# Patient Record
Sex: Male | Born: 1960 | Race: Black or African American | Hispanic: No | Marital: Single | Smoking: Never smoker
Health system: Southern US, Community
[De-identification: ages and names within clinical notes are randomized; demographics above are authoritative.]

## PROBLEM LIST (undated history)

## (undated) DIAGNOSIS — D649 Anemia, unspecified: Secondary | ICD-10-CM

## (undated) DIAGNOSIS — C20 Malignant neoplasm of rectum: Secondary | ICD-10-CM

## (undated) HISTORY — DX: Malignant neoplasm of rectum: C20

## (undated) HISTORY — DX: Anemia, unspecified: D64.9

---

## 2006-02-05 ENCOUNTER — Emergency Department (HOSPITAL_COMMUNITY): Admission: EM | Admit: 2006-02-05 | Discharge: 2006-02-05 | Payer: Self-pay | Admitting: Emergency Medicine

## 2006-02-06 ENCOUNTER — Ambulatory Visit (HOSPITAL_COMMUNITY): Admission: RE | Admit: 2006-02-06 | Discharge: 2006-02-06 | Payer: Self-pay | Admitting: Emergency Medicine

## 2006-02-06 ENCOUNTER — Encounter (INDEPENDENT_AMBULATORY_CARE_PROVIDER_SITE_OTHER): Payer: Self-pay | Admitting: *Deleted

## 2006-02-06 ENCOUNTER — Emergency Department (HOSPITAL_COMMUNITY): Admission: EM | Admit: 2006-02-06 | Discharge: 2006-02-06 | Payer: Self-pay | Admitting: Emergency Medicine

## 2007-08-03 ENCOUNTER — Ambulatory Visit: Payer: Self-pay | Admitting: Gastroenterology

## 2007-08-03 DIAGNOSIS — K625 Hemorrhage of anus and rectum: Secondary | ICD-10-CM

## 2009-06-23 DIAGNOSIS — C20 Malignant neoplasm of rectum: Secondary | ICD-10-CM

## 2009-06-23 HISTORY — DX: Malignant neoplasm of rectum: C20

## 2009-12-17 ENCOUNTER — Encounter: Admission: RE | Admit: 2009-12-17 | Discharge: 2009-12-17 | Payer: Self-pay | Admitting: Gastroenterology

## 2009-12-19 ENCOUNTER — Encounter: Admission: RE | Admit: 2009-12-19 | Discharge: 2009-12-19 | Payer: Self-pay | Admitting: General Surgery

## 2010-01-04 ENCOUNTER — Ambulatory Visit (HOSPITAL_COMMUNITY): Admission: RE | Admit: 2010-01-04 | Discharge: 2010-01-04 | Payer: Self-pay | Admitting: Gastroenterology

## 2010-01-09 ENCOUNTER — Ambulatory Visit: Payer: Self-pay | Admitting: Oncology

## 2010-01-21 ENCOUNTER — Ambulatory Visit (HOSPITAL_COMMUNITY): Admission: RE | Admit: 2010-01-21 | Discharge: 2010-01-21 | Payer: Self-pay | Admitting: General Surgery

## 2010-01-24 LAB — COMPREHENSIVE METABOLIC PANEL
ALT: 21 U/L (ref 0–53)
AST: 26 U/L (ref 0–37)
Albumin: 4.3 g/dL (ref 3.5–5.2)
Alkaline Phosphatase: 41 U/L (ref 39–117)
BUN: 13 mg/dL (ref 6–23)
Potassium: 4.1 mEq/L (ref 3.5–5.3)
Sodium: 141 mEq/L (ref 135–145)
Total Protein: 7.1 g/dL (ref 6.0–8.3)

## 2010-01-24 LAB — CBC WITH DIFFERENTIAL/PLATELET
BASO%: 0.7 % (ref 0.0–2.0)
EOS%: 3.3 % (ref 0.0–7.0)
MCH: 30.3 pg (ref 27.2–33.4)
MCHC: 34 g/dL (ref 32.0–36.0)
MONO#: 0.5 10*3/uL (ref 0.1–0.9)
RBC: 4.12 10*6/uL — ABNORMAL LOW (ref 4.20–5.82)
RDW: 14.4 % (ref 11.0–14.6)
WBC: 6.5 10*3/uL (ref 4.0–10.3)
lymph#: 1.2 10*3/uL (ref 0.9–3.3)

## 2010-01-24 LAB — CHCC SMEAR

## 2010-01-29 ENCOUNTER — Ambulatory Visit: Admission: RE | Admit: 2010-01-29 | Discharge: 2010-03-19 | Payer: Self-pay | Admitting: Radiation Oncology

## 2010-02-12 ENCOUNTER — Ambulatory Visit: Payer: Self-pay | Admitting: Oncology

## 2010-02-28 LAB — CBC WITH DIFFERENTIAL/PLATELET
Basophils Absolute: 0 10*3/uL (ref 0.0–0.1)
Eosinophils Absolute: 0.1 10*3/uL (ref 0.0–0.5)
HCT: 36.2 % — ABNORMAL LOW (ref 38.4–49.9)
HGB: 12.1 g/dL — ABNORMAL LOW (ref 13.0–17.1)
LYMPH%: 5.4 % — ABNORMAL LOW (ref 14.0–49.0)
MCV: 92 fL (ref 79.3–98.0)
MONO#: 0.3 10*3/uL (ref 0.1–0.9)
MONO%: 6.6 % (ref 0.0–14.0)
NEUT#: 3.8 10*3/uL (ref 1.5–6.5)
Platelets: 151 10*3/uL (ref 140–400)
RBC: 3.94 10*6/uL — ABNORMAL LOW (ref 4.20–5.82)
WBC: 4.6 10*3/uL (ref 4.0–10.3)

## 2010-02-28 LAB — BASIC METABOLIC PANEL
BUN: 11 mg/dL (ref 6–23)
CO2: 29 mEq/L (ref 19–32)
Calcium: 9.3 mg/dL (ref 8.4–10.5)
Glucose, Bld: 104 mg/dL — ABNORMAL HIGH (ref 70–99)
Sodium: 139 mEq/L (ref 135–145)

## 2010-03-08 LAB — CBC WITH DIFFERENTIAL/PLATELET
BASO%: 0.6 % (ref 0.0–2.0)
EOS%: 1.7 % (ref 0.0–7.0)
HCT: 36.9 % — ABNORMAL LOW (ref 38.4–49.9)
LYMPH%: 5.7 % — ABNORMAL LOW (ref 14.0–49.0)
MCH: 31.2 pg (ref 27.2–33.4)
MCHC: 33.5 g/dL (ref 32.0–36.0)
NEUT%: 82.1 % — ABNORMAL HIGH (ref 39.0–75.0)
Platelets: 155 10*3/uL (ref 140–400)
RBC: 3.96 10*6/uL — ABNORMAL LOW (ref 4.20–5.82)
WBC: 4.5 10*3/uL (ref 4.0–10.3)

## 2010-03-22 ENCOUNTER — Ambulatory Visit: Payer: Self-pay | Admitting: Oncology

## 2010-03-26 LAB — CBC WITH DIFFERENTIAL/PLATELET
Basophils Absolute: 0 10*3/uL (ref 0.0–0.1)
Eosinophils Absolute: 0.5 10*3/uL (ref 0.0–0.5)
HCT: 40.1 % (ref 38.4–49.9)
HGB: 13.2 g/dL (ref 13.0–17.1)
MCH: 31.2 pg (ref 27.2–33.4)
MONO#: 0.5 10*3/uL (ref 0.1–0.9)
NEUT#: 3.4 10*3/uL (ref 1.5–6.5)
NEUT%: 69.2 % (ref 39.0–75.0)
RDW: 18.6 % — ABNORMAL HIGH (ref 11.0–14.6)
WBC: 4.9 10*3/uL (ref 4.0–10.3)
lymph#: 0.5 10*3/uL — ABNORMAL LOW (ref 0.9–3.3)

## 2010-04-05 ENCOUNTER — Ambulatory Visit (HOSPITAL_COMMUNITY): Admission: RE | Admit: 2010-04-05 | Discharge: 2010-04-05 | Payer: Self-pay | Admitting: Gastroenterology

## 2010-05-02 ENCOUNTER — Ambulatory Visit: Payer: Self-pay | Admitting: Oncology

## 2010-05-06 LAB — COMPREHENSIVE METABOLIC PANEL
AST: 25 U/L (ref 0–37)
Alkaline Phosphatase: 39 U/L (ref 39–117)
BUN: 14 mg/dL (ref 6–23)
Creatinine, Ser: 1 mg/dL (ref 0.40–1.50)
Potassium: 4.2 mEq/L (ref 3.5–5.3)

## 2010-05-06 LAB — CBC WITH DIFFERENTIAL/PLATELET
BASO%: 0.3 % (ref 0.0–2.0)
Basophils Absolute: 0 10*3/uL (ref 0.0–0.1)
EOS%: 4 % (ref 0.0–7.0)
HGB: 12.8 g/dL — ABNORMAL LOW (ref 13.0–17.1)
MCH: 31.6 pg (ref 27.2–33.4)
MCHC: 33.1 g/dL (ref 32.0–36.0)
MCV: 95.4 fL (ref 79.3–98.0)
MONO%: 9.7 % (ref 0.0–14.0)
RDW: 17.7 % — ABNORMAL HIGH (ref 11.0–14.6)

## 2010-05-23 HISTORY — PX: LOW ANTERIOR BOWEL RESECTION: SUR1240

## 2010-05-30 ENCOUNTER — Inpatient Hospital Stay (HOSPITAL_COMMUNITY)
Admission: RE | Admit: 2010-05-30 | Discharge: 2010-06-04 | Payer: Self-pay | Source: Home / Self Care | Attending: General Surgery | Admitting: General Surgery

## 2010-05-30 ENCOUNTER — Encounter (INDEPENDENT_AMBULATORY_CARE_PROVIDER_SITE_OTHER): Payer: Self-pay | Admitting: General Surgery

## 2010-06-10 DIAGNOSIS — C7800 Secondary malignant neoplasm of unspecified lung: Secondary | ICD-10-CM | POA: Insufficient documentation

## 2010-06-13 ENCOUNTER — Ambulatory Visit: Payer: Self-pay | Admitting: Oncology

## 2010-07-13 ENCOUNTER — Other Ambulatory Visit: Payer: Self-pay | Admitting: General Surgery

## 2010-07-13 DIAGNOSIS — K913 Postprocedural intestinal obstruction, unspecified as to partial versus complete: Secondary | ICD-10-CM

## 2010-07-14 ENCOUNTER — Encounter: Payer: Self-pay | Admitting: Gastroenterology

## 2010-08-19 ENCOUNTER — Ambulatory Visit
Admission: RE | Admit: 2010-08-19 | Discharge: 2010-08-19 | Disposition: A | Discharge status: Home / Self Care | Payer: Medicare Other | Source: Ambulatory Visit | Attending: General Surgery | Admitting: General Surgery

## 2010-08-19 DIAGNOSIS — K913 Postprocedural intestinal obstruction, unspecified as to partial versus complete: Secondary | ICD-10-CM

## 2010-09-02 LAB — BASIC METABOLIC PANEL
BUN: 11 mg/dL (ref 6–23)
BUN: 4 mg/dL — ABNORMAL LOW (ref 6–23)
BUN: 6 mg/dL (ref 6–23)
BUN: 7 mg/dL (ref 6–23)
CO2: 30 mEq/L (ref 19–32)
CO2: 30 mEq/L (ref 19–32)
CO2: 32 mEq/L (ref 19–32)
Calcium: 8 mg/dL — ABNORMAL LOW (ref 8.4–10.5)
Chloride: 102 mEq/L (ref 96–112)
Chloride: 103 mEq/L (ref 96–112)
Chloride: 104 mEq/L (ref 96–112)
Creatinine, Ser: 0.94 mg/dL (ref 0.4–1.5)
Creatinine, Ser: 1.34 mg/dL (ref 0.4–1.5)
GFR calc Af Amer: >60 mL/min (ref >60)
GFR calc non Af Amer: >60 mL/min (ref >60)
GFR calc non Af Amer: >60 mL/min (ref >60)
Glucose, Bld: 105 mg/dL — ABNORMAL HIGH (ref 70–99)
Glucose, Bld: 114 mg/dL — ABNORMAL HIGH (ref 70–99)
Glucose, Bld: 120 mg/dL — ABNORMAL HIGH (ref 70–99)
Glucose, Bld: 164 mg/dL — ABNORMAL HIGH (ref 70–99)
Potassium: 3.9 mEq/L (ref 3.5–5.1)
Potassium: 4.6 mEq/L (ref 3.5–5.1)
Sodium: 138 mEq/L (ref 135–145)
Sodium: 140 mEq/L (ref 135–145)
Sodium: 140 mEq/L (ref 135–145)

## 2010-09-02 LAB — CROSSMATCH
ABO/RH(D): A POS
Unit division: 0

## 2010-09-02 LAB — CBC
HCT: 23.5 % — ABNORMAL LOW (ref 39.0–52.0)
HCT: 23.7 % — ABNORMAL LOW (ref 39.0–52.0)
HCT: 25.6 % — ABNORMAL LOW (ref 39.0–52.0)
HCT: 25.6 % — ABNORMAL LOW (ref 39.0–52.0)
Hemoglobin: 12.3 g/dL — ABNORMAL LOW (ref 13.0–17.0)
Hemoglobin: 7.9 g/dL — ABNORMAL LOW (ref 13.0–17.0)
Hemoglobin: 8 g/dL — ABNORMAL LOW (ref 13.0–17.0)
Hemoglobin: 8.6 g/dL — ABNORMAL LOW (ref 13.0–17.0)
MCH: 30.3 pg (ref 26.0–34.0)
MCH: 31 pg (ref 26.0–34.0)
MCH: 31.5 pg (ref 26.0–34.0)
MCH: 31.6 pg (ref 26.0–34.0)
MCH: 31.6 pg (ref 26.0–34.0)
MCH: 32 pg (ref 26.0–34.0)
MCHC: 32.8 g/dL (ref 30.0–36.0)
MCHC: 33.6 g/dL (ref 30.0–36.0)
MCHC: 33.7 g/dL (ref 30.0–36.0)
MCHC: 33.8 g/dL (ref 30.0–36.0)
MCV: 92.9 fL (ref 78.0–100.0)
MCV: 93.6 fL (ref 78.0–100.0)
MCV: 94.1 fL (ref 78.0–100.0)
MCV: 94.9 fL (ref 78.0–100.0)
MCV: 95.1 fL (ref 78.0–100.0)
MCV: 95.2 fL (ref 78.0–100.0)
Platelets: 152 10*3/uL (ref 150–400)
Platelets: 155 10*3/uL (ref 150–400)
RBC: 2.72 MIL/uL — ABNORMAL LOW (ref 4.22–5.81)
RBC: 3.5 MIL/uL — ABNORMAL LOW (ref 4.22–5.81)
RBC: 3.91 MIL/uL — ABNORMAL LOW (ref 4.22–5.81)
RDW: 13.9 % (ref 11.5–15.5)
RDW: 14 % (ref 11.5–15.5)
RDW: 14.1 % (ref 11.5–15.5)
RDW: 14.4 % (ref 11.5–15.5)
RDW: 14.4 % (ref 11.5–15.5)
WBC: 11.8 10*3/uL — ABNORMAL HIGH (ref 4.0–10.5)
WBC: 5.7 10*3/uL (ref 4.0–10.5)
WBC: 7.4 10*3/uL (ref 4.0–10.5)

## 2010-09-02 LAB — DIFFERENTIAL
Basophils Absolute: 0 10*3/uL (ref 0.0–0.1)
Eosinophils Relative: 1 % (ref 0–5)
Lymphocytes Relative: 10 % — ABNORMAL LOW (ref 12–46)
Neutro Abs: 3.7 10*3/uL (ref 1.7–7.7)

## 2010-09-02 LAB — COMPREHENSIVE METABOLIC PANEL
BUN: 11 mg/dL (ref 6–23)
CO2: 30 mEq/L (ref 19–32)
Chloride: 106 mEq/L (ref 96–112)
Creatinine, Ser: 1.18 mg/dL (ref 0.4–1.5)
GFR calc non Af Amer: >60 mL/min (ref >60)
Glucose, Bld: 105 mg/dL — ABNORMAL HIGH (ref 70–99)
Total Bilirubin: 0.6 mg/dL (ref 0.3–1.2)

## 2010-09-02 LAB — TYPE AND SCREEN
ABO/RH(D): A POS
Antibody Screen: NEGATIVE

## 2010-09-02 LAB — SURGICAL PCR SCREEN: Staphylococcus aureus: NEGATIVE

## 2010-09-02 LAB — MAGNESIUM: Magnesium: 1.8 mg/dL (ref 1.5–2.5)

## 2010-09-06 LAB — CBC
HCT: 37.8 % — ABNORMAL LOW (ref 39.0–52.0)
Hemoglobin: 12.5 g/dL — ABNORMAL LOW (ref 13.0–17.0)
MCH: 29.3 pg (ref 26.0–34.0)
MCHC: 33.1 g/dL (ref 30.0–36.0)
MCV: 88.5 fL (ref 78.0–100.0)
Platelets: 130 10*3/uL — ABNORMAL LOW (ref 150–400)
RBC: 4.27 MIL/uL (ref 4.22–5.81)
RDW: 13.5 % (ref 11.5–15.5)
WBC: 6.6 10*3/uL (ref 4.0–10.5)

## 2010-09-06 LAB — DIFFERENTIAL
Basophils Relative: 1 % (ref 0–1)
Eosinophils Absolute: 0.2 10*3/uL (ref 0.0–0.7)
Eosinophils Relative: 3 % (ref 0–5)
Monocytes Absolute: 0.6 10*3/uL (ref 0.1–1.0)
Monocytes Relative: 9 % (ref 3–12)
Neutrophils Relative %: 67 % (ref 43–77)

## 2010-09-06 LAB — BASIC METABOLIC PANEL
CO2: 28 mEq/L (ref 19–32)
Calcium: 9.2 mg/dL (ref 8.4–10.5)
Glucose, Bld: 93 mg/dL (ref 70–99)
Sodium: 138 mEq/L (ref 135–145)

## 2010-09-30 ENCOUNTER — Encounter (HOSPITAL_COMMUNITY): Payer: Medicare Other

## 2010-09-30 ENCOUNTER — Other Ambulatory Visit: Payer: Self-pay | Admitting: General Surgery

## 2010-09-30 DIAGNOSIS — Z01818 Encounter for other preprocedural examination: Secondary | ICD-10-CM | POA: Insufficient documentation

## 2010-09-30 DIAGNOSIS — Z01812 Encounter for preprocedural laboratory examination: Secondary | ICD-10-CM | POA: Insufficient documentation

## 2010-09-30 LAB — COMPREHENSIVE METABOLIC PANEL
Albumin: 4.1 g/dL (ref 3.5–5.2)
BUN: 12 mg/dL (ref 6–23)
Creatinine, Ser: 0.9 mg/dL (ref 0.4–1.5)
Glucose, Bld: 86 mg/dL (ref 70–99)
Total Bilirubin: 0.8 mg/dL (ref 0.3–1.2)
Total Protein: 7.3 g/dL (ref 6.0–8.3)

## 2010-09-30 LAB — DIFFERENTIAL
Basophils Absolute: 0 10*3/uL (ref 0.0–0.1)
Basophils Relative: 1 % (ref 0–1)
Eosinophils Relative: 1 % (ref 0–5)
Monocytes Absolute: 0.3 10*3/uL (ref 0.1–1.0)

## 2010-09-30 LAB — SURGICAL PCR SCREEN
MRSA, PCR: NEGATIVE
Staphylococcus aureus: NEGATIVE

## 2010-09-30 LAB — CBC
HCT: 41.2 % (ref 39.0–52.0)
MCH: 26.5 pg (ref 26.0–34.0)
MCHC: 30.8 g/dL (ref 30.0–36.0)
MCV: 85.8 fL (ref 78.0–100.0)
RDW: 17.1 % — ABNORMAL HIGH (ref 11.5–15.5)

## 2010-10-07 ENCOUNTER — Other Ambulatory Visit: Payer: Self-pay | Admitting: General Surgery

## 2010-10-07 ENCOUNTER — Inpatient Hospital Stay (HOSPITAL_COMMUNITY)
Admission: RE | Admit: 2010-10-07 | Discharge: 2010-10-12 | DRG: 331 | Disposition: A | Discharge status: Home / Self Care | Payer: Medicare Other | Source: Ambulatory Visit | Attending: General Surgery | Admitting: General Surgery

## 2010-10-07 DIAGNOSIS — Z432 Encounter for attention to ileostomy: Principal | ICD-10-CM

## 2010-10-07 DIAGNOSIS — Z01812 Encounter for preprocedural laboratory examination: Secondary | ICD-10-CM

## 2010-10-07 DIAGNOSIS — Z85048 Personal history of other malignant neoplasm of rectum, rectosigmoid junction, and anus: Secondary | ICD-10-CM

## 2010-10-07 DIAGNOSIS — K639 Disease of intestine, unspecified: Secondary | ICD-10-CM | POA: Diagnosis not present

## 2010-10-07 HISTORY — PX: RESECTION SMALL BOWEL / CLOSURE ILEOSTOMY: SUR1248

## 2010-10-07 LAB — TYPE AND SCREEN: ABO/RH(D): A POS

## 2010-10-07 LAB — ABO/RH: ABO/RH(D): A POS

## 2010-10-08 LAB — CBC
HCT: 34.3 % — ABNORMAL LOW (ref 39.0–52.0)
MCH: 26.8 pg (ref 26.0–34.0)
MCHC: 32.1 g/dL (ref 30.0–36.0)
RDW: 17.3 % — ABNORMAL HIGH (ref 11.5–15.5)

## 2010-10-08 LAB — BASIC METABOLIC PANEL
BUN: 8 mg/dL (ref 6–23)
CO2: 26 mEq/L (ref 19–32)
Calcium: 8.6 mg/dL (ref 8.4–10.5)
Creatinine, Ser: 0.84 mg/dL (ref 0.4–1.5)
GFR calc non Af Amer: >60 mL/min (ref >60)
Glucose, Bld: 139 mg/dL — ABNORMAL HIGH (ref 70–99)
Sodium: 136 mEq/L (ref 135–145)

## 2010-10-09 LAB — CBC
Hemoglobin: 11.1 g/dL — ABNORMAL LOW (ref 13.0–17.0)
MCH: 27.1 pg (ref 26.0–34.0)
MCHC: 32.3 g/dL (ref 30.0–36.0)
Platelets: 139 10*3/uL — ABNORMAL LOW (ref 150–400)
RDW: 17.6 % — ABNORMAL HIGH (ref 11.5–15.5)

## 2010-10-09 LAB — BASIC METABOLIC PANEL
Calcium: 8.6 mg/dL (ref 8.4–10.5)
Creatinine, Ser: 0.9 mg/dL (ref 0.4–1.5)
GFR calc Af Amer: >60 mL/min (ref >60)
GFR calc non Af Amer: >60 mL/min (ref >60)
Sodium: 140 mEq/L (ref 135–145)

## 2010-10-11 ENCOUNTER — Inpatient Hospital Stay (HOSPITAL_COMMUNITY): Payer: Medicare Other

## 2010-10-15 NOTE — Op Note (Signed)
[name], [name]               ACCOUNT NO.:  [id number]  MEDICAL RECORD NO.:  [id number]           PATIENT TYPE:  I  LOCATION:  1525                         FACILITY:  [facility]  PHYSICIAN:  Mary Sella. Andrey Campanile, MD     DATE OF BIRTH:  [birth year]  DATE OF PROCEDURE:  10/07/2010 DATE OF DISCHARGE:                              OPERATIVE REPORT   PREOPERATIVE DIAGNOSES: 1. Undesired loop ileostomy. 2. 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  POSTOPERATIVE DIAGNOSES: 1. Undesired loop ileostomy. 2. 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  PROCEDURE:  Ileostomy reversal with small bowel resection.  SURGEON:  Mary Sella. Andrey Campanile, MD  ASSISTANT SURGEON:  Lorne Skeens. Hoxworth, MD  ANESTHESIA:  General.  SPECIMEN:  Ileostomy.  FINDINGS:  We are able to bring up the loop through the stomal defect and exteriorize it, resect it, and do a side-to-side small bowel anastomosis and return into the abdomen without going through the midline.  INDICATIONS FOR PROCEDURE:  The patient is a morbidly obese 50 year old African American male with a history of uT3 N0 rectal cancer who underwent neoadjuvant chemotherapy and radiation followed by a laparoscopic-assisted low anterior resection with a diverting loop ileostomy  and his final pathology showed no residual cancer.  [name] presented to the office to discuss the ileostomy reversal. We discussed the risks and benefits of surgery including bleeding, infection, injury to surrounding structures, need to convert to a midline incision, anastomotic stricture, anastomotic leakage, incisional hernia formation, DVT occurrence, wound infection, ileus.  The patient elected to proceed to surgery.  DESCRIPTION OF PROCEDURE:  After obtaining  informed consent, the patient was taken back to the operating room.  Sequential compression devices were placed.  A Foley catheter was placed.  General endotracheal anesthesia had already been established.  He received Invanz prior to skin incision.  He also received IV Tylenol.  His abdomen was prepped and draped in usual standard surgical fashion.  I placed Ioban over his abdomen.  It should be noted that prior to making skin incision, the opening of the ostomy was sutured.  A surgical time-out was performed.  I made a circumferential incision around the ostomy in the right upper quadrant with a #15 blade.  The deep dermis was divided with electrocautery.  We then started to circumferentially dissecting around the bowel and subcutaneous tissue with electrocautery.  We eventually reached down to level of the fascia.  We had used appendiceal retractors in order to get down to the fascia.  Once we got down to the fascia, we started freeing up the bowel from the fascia.  This was done with the aid of a right angle as well as electrocautery.  Once we separated it from the fascial edge, I was able to get my finger into the peritoneal cavity and sweep around.  There were very few attachments anchoring the bowel to  the abdominal cavity.  These were taken down with electrocautery.  There was a bleeder on the fascial surface that we had oversew with several interrupted 3-0 Vicryl sutures.  This was on the inferior aspect of the fascial defect.  I was able to completely free the bowel and we were then able to deliver the ostomy and additional small bowel through the fascial defect.  We freed some interloop adhesions with mets.  I then started to divide the small bowel. A small rent was made in the mesentery about 4 cm distal to the downstream bowel of the ostomy.  We then transected the small bowel with a linear cutter GIA 75-mm blue load.  I again created a small rent in the mesentery just next  to the bowel lumen of the upstream part of the ostomy about 4 cm. The bowel was divided with another load of a GIA stapler with a blue load 75 mm in length.  We then took down the mesentery in a serial fashion staying close to the bowel wall of the ostomy.  This freed the ostomy.  This was passed off the field.  I then brought both ends of the small bowel in a side-to-side fashion.  A 3-0 Vicryl suture was used as an anchoring stitch just next to the staple line on each limb. Enterotomies were then made in the upstream portion of the bowel and enterotomy was made in the downstream portion with the electrocautery. It was opened up slightly with a hemostat.  One limb of a GIA stapler was placed through the enterotomies and the stapler was brought together.  This was a 75-mm GIA with a blue load.  The staplers brought together, we made sure that the bowel was not twisted.  The stapler was fired to create a common channel.  I then closed the enterostomy of the side-to-side anastomosis with interrupted 2-0 silk sutures.  I then placed in 3-0 Lembert sutures on top of that.  Anastomosis was widely patent.  A 3-0 silk was placed in the crotch of the anastomosis.  There was no evidence of bleeding.  We returned the bowel back to the abdominal cavity.  I then closed the fascia with interrupted #1 PDS sutures.  The subcutaneous tissue was irrigated.  I then placed 1 staple on each end of the skin defect and packed the rest of the stoma defect with Telfa wicks.  Followed by 4 x 4s and tape.  The patient was extubated and taken to the recovery room in stable condition.  There were no immediate complications.  The patient tolerated the procedure well.  All needle and instrument counts were correct x2.     Mary Sella. Andrey Campanile, MD     EMW/MEDQ  D:  10/07/2010  T:  10/08/2010  Job:  [id number]  cc:   Quenton Fetter, M.D. Fax: [phone]  NWGNFA OZH YQMV, MD, Luella.Lat Fax: [phone]  Billie Lade,  Ph.D., M.D. Fax: [phone]  Electronically Signed by Gaynelle Adu M.D. on 10/15/2010 08:31:32 AM

## 2010-10-30 ENCOUNTER — Encounter (INDEPENDENT_AMBULATORY_CARE_PROVIDER_SITE_OTHER): Payer: Self-pay | Admitting: General Surgery

## 2010-11-05 NOTE — Assessment & Plan Note (Signed)
 HEALTHCARE                         GASTROENTEROLOGY OFFICE NOTE   NAME:[name], [name]                        MRN:          [id number]  DATE:08/03/2007                            DOB:          October 10, 1960    REASON FOR CONSULTATION:  Rectal bleeding.   Mr. Glasscock is a 50 year old African-American male, referred through the  courtesy of [facility], P.A[facility] for evaluation.  He has had several  episodes of blood mixed with his stools.  He occasionally has had bowel  movements consisting only of blood.  He has occasional erectile  discomfort.  He denies abdominal pain or change in his bowel habits.   PAST MEDICAL HISTORY:  Pertinent for a disability for psychiatric  problems.   FAMILY HISTORY:  Noncontributory.   MEDICATIONS:  Include lisinopril/HCTZ.   He has no allergies.   He neither smokes nor drinks.  He is single and unemployed.   REVIEW OF SYSTEMS:  Positive for occasional back pain.   PHYSICAL EXAM:  Pulse 72, blood pressure 122/72, weight 318.  HEENT: EOMI.  PERRLA.  Sclerae are anicteric.  Conjunctivae are pink.  NECK:  Supple without thyromegaly, adenopathy or carotid bruits.  CHEST:  Clear to auscultation and percussion without adventitious  sounds.  CARDIAC:  Regular rhythm; normal S1 S2.  There are no murmurs, gallops  or rubs.  ABDOMEN:  Bowel sounds are normoactive.  Abdomen is soft, nontender and  nondistended.  There are no abdominal masses, tenderness, splenic  enlargement or hepatomegaly.  EXTREMITIES:  Full range of motion.  No cyanosis, clubbing or edema.  RECTAL:  There are no frank rectal masses.  Stool is trace Hemoccult  positive.   IMPRESSION:  Limited rectal bleeding.  Chronic bleeding sources  including hemorrhoids, polyps, AVMs and neoplasm, are considerations.   RECOMMENDATION:  Colonoscopy.     Barbette Hair. Arlyce Dice, MD,FACG  Electronically Signed   RDK/MedQ  DD: 08/03/2007  DT: 08/04/2007  Job #: [id number]   cc:    [facility], [facility]

## 2010-11-05 NOTE — Letter (Signed)
August 03, 2007    [facility] Romans, P.A[facility]  [facility]   RE:  [name], [name]  MRN:  [id number]  /  DOB:  1960/06/28   Dear Ms. Weber:   Upon your kind referral, I had the pleasure of evaluating your patient  and I am pleased to offer my findings.  I saw Etai Copado in the  office today.  Enclosed is a copy of my progress note that details my  findings and recommendations.   Thank you for the opportunity to participate in your patient's care.    Sincerely,      Barbette Hair. Arlyce Dice, MD,FACG  Electronically Signed    RDK/MedQ  DD: 08/03/2007  DT: 08/04/2007  Job #: [id number]

## 2010-11-15 NOTE — Discharge Summary (Signed)
  [name], HOLZHEIMER               ACCOUNT NO.:  [id number]  MEDICAL RECORD NO.:  [id number]           PATIENT TYPE:  I  LOCATION:  1525                         FACILITY:  [facility]  PHYSICIAN:  Mary Sella. Andrey Campanile, MD     DATE OF BIRTH:  08/11/1960  DATE OF ADMISSION:  10/07/2010 DATE OF DISCHARGE:  10/12/2010                              DISCHARGE SUMMARY   ADMITTING PHYSICIAN:  Dr. Gaynelle Adu.  DISCHARGING PHYSICIAN:  Dr. Glenna Fellows  ADMITTING DIAGNOSES: 1. Morbid obesity. 2. Developmental delay. 3. History of uT3 N0 rectal cancer status post neoadjuvant therapy as     well as laparoscopic-assisted low anterior resection with a     diverting loop ileostomy, May 30, 2000.  DISCHARGE DIAGNOSES: 1. Morbid obesity. 2. Development delay. 3. History of uT3 N0 rectal cancer status post neoadjuvant therapy and     laparoscopic-assisted low anterior resection with a diverting loop     ileostomy.  PROCEDURES:  During hospitalization, ileostomy reversal with small bowel resection on October 07, 2010.  BRIEF HOSPITAL COURSE:  The patient went to the operating room for the above-mentioned procedure.  It had been several months from the surgery and he had healed without complication.  Preoperative area enema demonstrated patency of his rectal anastomosis without any signs of extravasation or stricture.  Postoperatively he had a small ileus, regained bowel function in a few days from surgery.  Wet-to-dry dressings were placed into his old stoma site.  He was maintained on chemical DVT prophylaxis on postoperative day #5.  He was seen to be stable for discharge.  He was voiding without complication.  He had had two bowel movements.  He was tolerating regular diet.  He is discharged to home with family.  DISCHARGE MEDICATIONS:  He is going to continue his home medications of calcium, multivitamin, vitamin C, vitamin D, vitamin E.  He was given a prescription for 1 to 2 Vicodin  tablets every 4 hours as needed for pain.  DISCHARGE INSTRUCTIONS:  He was instructed to call the office for fevers, chills, nausea, vomiting, worsening pain.  He needs to follow for wound complications such as redness or purulent drainage or any questions or concerns.  Discharge instructions, he is to increase his activity slowly.  He was instructed that he may shower.  No heavy lifting for 4 weeks.  He is to do wet-to-dry dressing changes twice a day to his old ostomy site.     Mary Sella. Andrey Campanile, MD     EMW/MEDQ  D:  10/30/2010  T:  10/30/2010  Job:  [id number]  Electronically Signed by Gaynelle Adu M.D. on 11/15/2010 08:54:51 AM

## 2011-06-11 ENCOUNTER — Encounter (INDEPENDENT_AMBULATORY_CARE_PROVIDER_SITE_OTHER): Payer: Self-pay | Admitting: General Surgery

## 2011-06-11 ENCOUNTER — Ambulatory Visit (INDEPENDENT_AMBULATORY_CARE_PROVIDER_SITE_OTHER): Payer: Medicare Other | Admitting: General Surgery

## 2011-06-11 VITALS — BP 142/100 | HR 68 | Temp 97.8°F | Resp 20 | Ht 74.5 in | Wt 267.8 lb

## 2011-06-11 DIAGNOSIS — Z85048 Personal history of other malignant neoplasm of rectum, rectosigmoid junction, and anus: Secondary | ICD-10-CM

## 2011-06-11 NOTE — Patient Instructions (Signed)
Please get tests and labs done as ordered

## 2011-06-11 NOTE — Progress Notes (Signed)
Patient ID: [name], male   DOB: June 04, 1961, 50 y.o.   MRN: [id number]  Chief Complaint  Patient presents with  . Follow-up    f/u rectal ca and underlining ileostomy    HPI Julian VUOLO is a 50 y.o. male.   HPI 50 year old obese African American male comes in for long-term followup for his personal history of rectal cancer. I last saw him in June 2012. He was supposed to get a CEA level drawn at the time. However he did not. He was diagnosed with uT3N0 rectal cancer in the fall 2011. He underwent neoadjuvant chemo and radiation followed by surgery. Final path showed no residual cancer. His ileostomy was reversed in April 2012.   Since he was last seen, he and his mother report that he has been doing fine. He denies any abdominal pain, nausea, vomiting, diarrhea, constipation, melena, hematochezia, weight loss. He denies any new medical diagnoses or trips to the emergency room.   Past Medical History  Diagnosis Date  . Anemia   . Rectal cancer 2011    uT3N0; neoadju txt & surgery; final path - no residual cancer    Past Surgical History  Procedure Date  . Low anterior bowel resection 05/2010    Lap assist; with loop ileostomy  . Resection small bowel / closure ileostomy 10/07/2010    Family History  Problem Relation Age of Onset  . Heart disease Mother   . Diabetes Mother   . Diabetes Father   . Cancer Father     colon  . Stroke Father     Social History History  Substance Use Topics  . Smoking status: Never Smoker   . Smokeless tobacco: Not on file  . Alcohol Use: No    No Known Allergies  No current outpatient prescriptions on file.    Review of Systems Review of Systems  Constitutional: Negative for fever, activity change, appetite change, fatigue and unexpected weight change.  HENT: Negative for neck pain and neck stiffness.   Eyes: Negative for photophobia and visual disturbance.  Respiratory: Negative for shortness of breath.   Cardiovascular:  Negative for chest pain.  Gastrointestinal: Negative for nausea, abdominal pain, diarrhea, constipation, blood in stool, abdominal distention and rectal pain.  Genitourinary: Negative for dysuria and difficulty urinating.  Musculoskeletal: Negative.   Neurological: Negative for syncope and light-headedness.  Hematological: Negative.   Psychiatric/Behavioral: Negative.     Blood pressure 142/100, pulse 68, temperature 97.8 F (36.6 C), temperature source Temporal, resp. rate 20, height 6' 2.5" (1.892 m), weight 267 lb 12.8 oz (121.473 kg).  Physical Exam Physical Exam  Vitals reviewed. Constitutional: He appears well-developed and well-nourished. No distress.       Obese   HENT:  Head: Normocephalic and atraumatic.  Eyes: Conjunctivae are normal. No scleral icterus.  Neck: Normal range of motion. No tracheal deviation present.  Cardiovascular: Normal rate, regular rhythm and intact distal pulses.   Pulmonary/Chest: Effort normal and breath sounds normal. No respiratory distress. He has no wheezes.  Abdominal: Soft. Bowel sounds are normal. He exhibits no distension. There is no tenderness. There is no rebound.    Musculoskeletal: Normal range of motion. He exhibits no edema.  Lymphadenopathy:    He has no cervical adenopathy.  Neurological: He is alert.  Skin: Skin is warm and dry.  Psychiatric: He has a normal mood and affect. His behavior is normal.    Data Reviewed Reviewed my office note from 4/12 and path reports  Assessment    Hx of uT3N0 rectal s/p neoadjuvant therapy followed by LAR & subsequent ostomy closure    Plan    Doing well Check cea Check ct abd/pelvis; couldn't get EMR to approve ct chest b/c of pt's medicare, will get cxr for now, if abnml labs or ct a/p will press for ct chest Ref to Dr Loreta Ave for surveillance colonoscopy since 1 year out from surgery RTC 6 months (sooner if abnml results)  Mary Sella. Andrey Campanile, MD, FACS General, Bariatric, & Minimally  Invasive Surgery [facility], [place]        [facility] M 06/11/2011, 10:37 AM

## 2011-06-13 ENCOUNTER — Ambulatory Visit
Admission: RE | Admit: 2011-06-13 | Discharge: 2011-06-13 | Disposition: A | Discharge status: Home / Self Care | Payer: Medicare Other | Source: Ambulatory Visit | Attending: General Surgery | Admitting: General Surgery

## 2011-06-13 DIAGNOSIS — Z85048 Personal history of other malignant neoplasm of rectum, rectosigmoid junction, and anus: Secondary | ICD-10-CM

## 2011-06-13 MED ORDER — IOHEXOL 300 MG/ML  SOLN
125.0000 mL | Freq: Once | INTRAMUSCULAR | Status: AC | PRN
  Administered 2011-06-13: 125 mL via INTRAVENOUS

## 2011-07-14 ENCOUNTER — Telehealth (INDEPENDENT_AMBULATORY_CARE_PROVIDER_SITE_OTHER): Payer: Self-pay | Admitting: General Surgery

## 2011-07-14 ENCOUNTER — Other Ambulatory Visit: Payer: Self-pay | Admitting: *Deleted

## 2011-07-14 NOTE — Progress Notes (Signed)
Message from MD that patient needs appointment in clinic for new colon tumor. Orders to scheduler.

## 2011-07-14 NOTE — Telephone Encounter (Signed)
Waiting on pathology report to come back to make appt for patient to see Dr Andrey Campanile. He may also need added to the GI conference, pending pathology results.

## 2011-07-15 ENCOUNTER — Telehealth: Payer: Self-pay | Admitting: Oncology

## 2011-07-15 NOTE — Telephone Encounter (Signed)
called pts mother and provided appt for 08/01/2011

## 2011-07-23 ENCOUNTER — Encounter (INDEPENDENT_AMBULATORY_CARE_PROVIDER_SITE_OTHER): Payer: Self-pay

## 2011-07-23 NOTE — Telephone Encounter (Signed)
Spoke with patient's mother. Made aware he should follow up with Dr Truett Perna, not Dr Andrey Campanile at this time. They have an appt with him next Friday and will call us if they need [place].

## 2011-08-01 ENCOUNTER — Telehealth: Payer: Self-pay | Admitting: Oncology

## 2011-08-01 ENCOUNTER — Ambulatory Visit (HOSPITAL_BASED_OUTPATIENT_CLINIC_OR_DEPARTMENT_OTHER): Payer: Medicare Other | Admitting: Nurse Practitioner

## 2011-08-01 VITALS — BP 136/79 | HR 79 | Temp 97.7°F | Ht 74.5 in | Wt 265.0 lb

## 2011-08-01 DIAGNOSIS — C20 Malignant neoplasm of rectum: Secondary | ICD-10-CM

## 2011-08-01 DIAGNOSIS — C7800 Secondary malignant neoplasm of unspecified lung: Secondary | ICD-10-CM

## 2011-08-01 DIAGNOSIS — Z85048 Personal history of other malignant neoplasm of rectum, rectosigmoid junction, and anus: Secondary | ICD-10-CM

## 2011-08-01 NOTE — Telephone Encounter (Signed)
gv pt appt for feb2013.  scheduled pt appt ct scan on 02/13 @ [facility]

## 2011-08-01 NOTE — Progress Notes (Signed)
OFFICE PROGRESS NOTE  Interval history:  Mr. Kreuzer is a 51 year old man diagnosed with rectal cancer, clinical uT3 uN0, status post concurrent chemotherapy/radiation with radiation completed March 11, 2010.  He underwent laparoscopic-assisted low anterior resection with diverting loop ileostomy 05/30/2010 with final pathology showing no evidence of malignancy. He declined adjuvant chemotherapy.  CT scans of the abdomen and pelvis 06/12/2012 showed interval development of multiple pulmonary nodules in both lung bases. The largest nodule was in the right lower lobe measuring 8 mm. Some of the nodules were cavitary. The liver was unremarkable. There were no focal lesions to suggest metastatic disease. There were surgical changes at the rectosigmoid junction. The findings for recurrent tumor or pelvic adenopathy. There was no inguinal mass or adenopathy.  He underwent a colonoscopy on 07/14/2011. He was found to have a mass at the distal rectum. We do not have the colonoscopy report. Rectal biopsy at 10 cm showed invasive colorectal adenocarcinoma, moderately differentiated.  Mr. Gavitt reports a several week history of blood with bowel movements. He denies any change in bowel habits. He has a good appetite and good energy level. No nausea or vomiting. No shortness of breath or cough.   Objective: Blood pressure 136/79, pulse 79, temperature 97.7 F (36.5 C), temperature source Oral, height 6' 2.5" (1.892 m), weight 265 lb (120.203 kg).  Oropharynx is without thrush or ulceration. No palpable cervical, supraclavicular, axillary or inguinal lymph nodes. Lungs are clear. No wheezes or rales. Regular cardiac rhythm. Abdomen is soft and nontender. No organomegaly. Extremities are without edema.  Lab Results: Lab Results  Component Value Date   WBC 6.6 10/09/2010   HGB 11.1* 10/09/2010   HCT 34.4* 10/09/2010   MCV 83.9 10/09/2010   PLT 139* 10/09/2010    Chemistry:    Chemistry      Component  Value Date/Time   NA 140 10/09/2010 0431   K 4.2 10/09/2010 0431   CL 106 10/09/2010 0431   CO2 29 10/09/2010 0431   BUN 7 10/09/2010 0431   CREATININE 0.90 10/09/2010 0431      Component Value Date/Time   CALCIUM 8.6 10/09/2010 0431   ALKPHOS 50 09/30/2010 1100   AST 16 09/30/2010 1100   ALT 16 09/30/2010 1100   BILITOT 0.8 09/30/2010 1100       Studies/Results: No results found.  Medications: I have reviewed the patient's current medications.  Assessment/Plan:  1.  Rectal cancer, clinical uT3 uN0, status post concurrent chemotherapy/radiation with radiation completed March 11, 2010.  He is status post laparoscopic-assisted low anterior resection with diverting loop ileostomy 05/30/2010 with final pathology showing no evidence of malignancy. He declined adjuvant chemotherapy. 2.  History of sigmoid colon polyp at 20.0 cm status post biopsy confirming high-grade dysplasia. 3.  History of mild anemia likely related to chemotherapy/radiation and rectal bleeding. 4.  Mental disability. 5.  History of bilateral leg edema status post negative Doppler of the left leg December 19, 2009. 6.  Family history of multiple cancers. 7.  Colonoscopy 07/14/2011 with finding of a distal rectal mass status post biopsy with pathology showing invasive colorectal adenocarcinoma, moderately differentiated. Biopsy of a polyp in the ascending colon showed a tubular adenoma. 8.  Chest CT 06/13/2011 with interval development of multiple pulmonary nodules in both lung bases.  Disposition-Mr. Harvie appears to have metastatic rectal cancer. Dr. Truett Perna reviewed the diagnosis, prognosis and treatment options with Mr. Sikorski and his family. Dr. Truett Perna recommends treatment on the FOLFOX regimen. We reviewed potential toxicities  including myelosuppression, mouth sores, nausea, diarrhea, hand-foot syndrome, skin hyperpigmentation, ocular toxicity, an allergic reaction (possibly severe). We reviewed the potential for  neurotoxicity associated with oxaliplatin. We are referring Mr. Shanker back to Dr. Andrey Campanile for Port-A-Cath placement. He will attend a chemotherapy education class. We will obtain baseline labs to include CBC, chemistry panel and CEA and will also obtain a baseline noncontrast CT scan of the chest prior to the first cycle of chemotherapy. We are tentatively scheduling him for cycle #1 FOLFOX 08/18/2011. He will be seen in followup prior to proceeding with the first treatment. He will contact the office in the interim with any problems.  Patient seen with Dr. Truett Perna.  Lonna Cobb ANP/GNP-BC

## 2011-08-04 ENCOUNTER — Telehealth: Payer: Self-pay | Admitting: Oncology

## 2011-08-04 ENCOUNTER — Telehealth (INDEPENDENT_AMBULATORY_CARE_PROVIDER_SITE_OTHER): Payer: Self-pay | Admitting: General Surgery

## 2011-08-04 ENCOUNTER — Telehealth: Payer: Self-pay | Admitting: *Deleted

## 2011-08-04 NOTE — Telephone Encounter (Signed)
Sister wishes to speak with MD or midlevel regarding chemo treatment. They have decided to pursue the oral chemo treatment only and do not want PAC or IV treatment due to his other health conditions.

## 2011-08-04 NOTE — Telephone Encounter (Signed)
called [facility] to schedule pt for port placment asap

## 2011-08-04 NOTE — Telephone Encounter (Signed)
Patient's grandmother called re: PAC placement. They are not going to have this done. Patient has decided he is not going to undergo chemotherapy. They will follow up as needed.

## 2011-08-05 ENCOUNTER — Telehealth: Payer: Self-pay | Admitting: *Deleted

## 2011-08-05 ENCOUNTER — Telehealth: Payer: Self-pay | Admitting: Oncology

## 2011-08-05 NOTE — Telephone Encounter (Signed)
Sister called back to report that after Dr. Truett Perna spoke with her yesterday, the family talked and have decided to proceed with [facility] placement and chemo. Notified scheduler to make contact with surgeon office again to schedule placement.

## 2011-08-05 NOTE — Telephone Encounter (Signed)
called [facility] to scheduled pt for port placement with Dr. Andrey Campanile

## 2011-08-06 ENCOUNTER — Other Ambulatory Visit (INDEPENDENT_AMBULATORY_CARE_PROVIDER_SITE_OTHER): Payer: Self-pay | Admitting: General Surgery

## 2011-08-06 ENCOUNTER — Ambulatory Visit (HOSPITAL_COMMUNITY)
Admission: RE | Admit: 2011-08-06 | Discharge: 2011-08-06 | Disposition: A | Discharge status: Home / Self Care | Payer: Medicare Other | Source: Ambulatory Visit | Attending: Nurse Practitioner | Admitting: Nurse Practitioner

## 2011-08-06 ENCOUNTER — Encounter (HOSPITAL_COMMUNITY): Payer: Self-pay

## 2011-08-06 ENCOUNTER — Telehealth: Payer: Self-pay | Admitting: *Deleted

## 2011-08-06 DIAGNOSIS — C20 Malignant neoplasm of rectum: Secondary | ICD-10-CM

## 2011-08-06 DIAGNOSIS — Z85048 Personal history of other malignant neoplasm of rectum, rectosigmoid junction, and anus: Secondary | ICD-10-CM | POA: Insufficient documentation

## 2011-08-06 DIAGNOSIS — R911 Solitary pulmonary nodule: Secondary | ICD-10-CM | POA: Insufficient documentation

## 2011-08-06 NOTE — Telephone Encounter (Signed)
PAC scheduled for 08/14/11 at 1:15pm at [facility] per Dr. Andrey Campanile.

## 2011-08-07 ENCOUNTER — Encounter: Payer: Self-pay | Admitting: *Deleted

## 2011-08-07 ENCOUNTER — Other Ambulatory Visit (HOSPITAL_BASED_OUTPATIENT_CLINIC_OR_DEPARTMENT_OTHER): Payer: Medicare Other

## 2011-08-07 ENCOUNTER — Other Ambulatory Visit: Payer: Medicare Other

## 2011-08-07 DIAGNOSIS — C20 Malignant neoplasm of rectum: Secondary | ICD-10-CM

## 2011-08-07 DIAGNOSIS — Z85048 Personal history of other malignant neoplasm of rectum, rectosigmoid junction, and anus: Secondary | ICD-10-CM

## 2011-08-07 LAB — CBC WITH DIFFERENTIAL/PLATELET
BASO%: 0.1 % (ref 0.0–2.0)
EOS%: 1.9 % (ref 0.0–7.0)
HCT: 38.3 % — ABNORMAL LOW (ref 38.4–49.9)
LYMPH%: 10.7 % — ABNORMAL LOW (ref 14.0–49.0)
MCH: 31 pg (ref 27.2–33.4)
MCHC: 34.1 g/dL (ref 32.0–36.0)
MONO%: 2.1 % (ref 0.0–14.0)
NEUT%: 85.2 % — ABNORMAL HIGH (ref 39.0–75.0)
Platelets: 145 10*3/uL (ref 140–400)
RBC: 4.22 10*6/uL (ref 4.20–5.82)

## 2011-08-07 LAB — COMPREHENSIVE METABOLIC PANEL
ALT: 10 U/L (ref 0–53)
AST: 14 U/L (ref 0–37)
Alkaline Phosphatase: 46 U/L (ref 39–117)
CO2: 25 mEq/L (ref 19–32)
Creatinine, Ser: 1.25 mg/dL (ref 0.50–1.35)
Total Bilirubin: 0.5 mg/dL (ref 0.3–1.2)

## 2011-08-07 LAB — CEA: CEA: 3.7 ng/mL (ref 0.0–5.0)

## 2011-08-11 ENCOUNTER — Encounter (HOSPITAL_COMMUNITY): Payer: Self-pay | Admitting: Pharmacy Technician

## 2011-08-12 ENCOUNTER — Encounter (HOSPITAL_COMMUNITY)
Admission: RE | Admit: 2011-08-12 | Discharge: 2011-08-12 | Disposition: A | Discharge status: Home / Self Care | Payer: Medicare Other | Source: Ambulatory Visit | Attending: General Surgery | Admitting: General Surgery

## 2011-08-12 ENCOUNTER — Encounter (HOSPITAL_COMMUNITY): Payer: Self-pay

## 2011-08-12 LAB — SURGICAL PCR SCREEN: MRSA, PCR: NEGATIVE

## 2011-08-12 MED ORDER — CHLORHEXIDINE GLUCONATE 4 % EX LIQD
1.0000 "application " | Freq: Once | CUTANEOUS | Status: DC
  Filled 2011-08-12: qty 15

## 2011-08-12 NOTE — Patient Instructions (Signed)
08-11-1318 [name]  08/12/2011   Your procedure is scheduled on: 08-14-11  Report to [facility] at    11:30    AM.  Call this number if you have problems the morning of surgery: [phone]   Remember:   Do not eat food:After Midnight.  May have clear liquids:up to 6 Hours before arrival. Nothing after : 0700am  Clear liquids include soda, tea, black coffee, apple or grape juice, broth.  Take these medicines the morning of surgery with A SIP OF WATER: none   Do not wear jewelry, make-up or nail polish.  Do not wear lotions, powders, or perfumes. You may wear deodorant.  Do not shave 48 hours prior to surgery.(may shave neck and face)  Do not bring valuables to the hospital.  Contacts, dentures or bridgework may not be worn into surgery.  Leave suitcase in the car. After surgery it may be brought to your room.  For patients admitted to the hospital, checkout time is 11:00 AM the day of discharge.   Patients discharged the day of surgery will not be allowed to drive home.  Name and phone number of your driver: sister Romond Pipkins  Special Instructions: CHG Shower Use Special Wash: 1/2 bottle night before surgery and 1/2 bottle morning of surgery.   Please read over the following fact sheets that you were given: MRSA Information

## 2011-08-14 ENCOUNTER — Encounter (HOSPITAL_COMMUNITY)
Admission: RE | Disposition: A | Discharge status: Home / Self Care | Payer: Self-pay | Source: Ambulatory Visit | Attending: General Surgery

## 2011-08-14 ENCOUNTER — Encounter (HOSPITAL_COMMUNITY): Payer: Self-pay | Admitting: Anesthesiology

## 2011-08-14 ENCOUNTER — Encounter (HOSPITAL_COMMUNITY): Payer: Self-pay | Admitting: *Deleted

## 2011-08-14 ENCOUNTER — Ambulatory Visit (HOSPITAL_COMMUNITY): Payer: Medicare Other

## 2011-08-14 ENCOUNTER — Ambulatory Visit (HOSPITAL_COMMUNITY)
Admission: RE | Admit: 2011-08-14 | Discharge: 2011-08-14 | Disposition: A | Discharge status: Home / Self Care | Payer: Medicare Other | Source: Ambulatory Visit | Attending: General Surgery | Admitting: General Surgery

## 2011-08-14 ENCOUNTER — Ambulatory Visit (HOSPITAL_COMMUNITY): Payer: Medicare Other | Admitting: Anesthesiology

## 2011-08-14 DIAGNOSIS — C20 Malignant neoplasm of rectum: Secondary | ICD-10-CM | POA: Insufficient documentation

## 2011-08-14 DIAGNOSIS — Z01812 Encounter for preprocedural laboratory examination: Secondary | ICD-10-CM | POA: Insufficient documentation

## 2011-08-14 DIAGNOSIS — C7800 Secondary malignant neoplasm of unspecified lung: Secondary | ICD-10-CM | POA: Insufficient documentation

## 2011-08-14 HISTORY — PX: PORTACATH PLACEMENT: SHX2246

## 2011-08-14 LAB — APTT: aPTT: 41 seconds — ABNORMAL HIGH (ref 24–37)

## 2011-08-14 LAB — PROTIME-INR: Prothrombin Time: 13 seconds (ref 11.6–15.2)

## 2011-08-14 SURGERY — INSERTION, TUNNELED CENTRAL VENOUS DEVICE, WITH PORT
Anesthesia: General | Site: Neck | Laterality: Right | Wound class: Clean

## 2011-08-14 MED ORDER — HEPARIN SOD (PORK) LOCK FLUSH 100 UNIT/ML IV SOLN
INTRAVENOUS | Status: DC | PRN
  Administered 2011-08-14: 500 [IU] via INTRAVENOUS

## 2011-08-14 MED ORDER — SODIUM CHLORIDE 0.9 % IR SOLN
Status: DC | PRN
  Administered 2011-08-14: 14:00:00

## 2011-08-14 MED ORDER — OXYCODONE-ACETAMINOPHEN 5-325 MG PO TABS: 1.0000 | ORAL_TABLET | ORAL | Status: AC | PRN

## 2011-08-14 MED ORDER — LACTATED RINGERS IV SOLN: INTRAVENOUS | Status: DC

## 2011-08-14 MED ORDER — PROPOFOL 10 MG/ML IV BOLUS
INTRAVENOUS | Status: DC | PRN
  Administered 2011-08-14: 200 mg via INTRAVENOUS

## 2011-08-14 MED ORDER — FENTANYL CITRATE 0.05 MG/ML IJ SOLN
INTRAMUSCULAR | Status: AC
  Filled 2011-08-14: qty 2

## 2011-08-14 MED ORDER — CEFAZOLIN SODIUM-DEXTROSE 2-3 GM-% IV SOLR
2.0000 g | INTRAVENOUS | Status: AC
  Administered 2011-08-14: 2 g via INTRAVENOUS

## 2011-08-14 MED ORDER — MIDAZOLAM HCL 5 MG/5ML IJ SOLN
INTRAMUSCULAR | Status: DC | PRN
  Administered 2011-08-14: 2 mg via INTRAVENOUS

## 2011-08-14 MED ORDER — ONDANSETRON HCL 4 MG/2ML IJ SOLN
INTRAMUSCULAR | Status: DC | PRN
  Administered 2011-08-14: 4 mg via INTRAVENOUS

## 2011-08-14 MED ORDER — LIDOCAINE HCL (CARDIAC) 20 MG/ML IV SOLN
INTRAVENOUS | Status: DC | PRN
  Administered 2011-08-14: 75 mg via INTRAVENOUS

## 2011-08-14 MED ORDER — LACTATED RINGERS IV SOLN
INTRAVENOUS | Status: DC | PRN
  Administered 2011-08-14 (×2): via INTRAVENOUS

## 2011-08-14 MED ORDER — FENTANYL CITRATE 0.05 MG/ML IJ SOLN
25.0000 ug | INTRAMUSCULAR | Status: DC | PRN
  Administered 2011-08-14: 50 ug via INTRAVENOUS

## 2011-08-14 MED ORDER — CEFAZOLIN SODIUM-DEXTROSE 2-3 GM-% IV SOLR
INTRAVENOUS | Status: AC
  Filled 2011-08-14: qty 50

## 2011-08-14 MED ORDER — PROMETHAZINE HCL 25 MG/ML IJ SOLN: 6.2500 mg | INTRAMUSCULAR | Status: DC | PRN

## 2011-08-14 MED ORDER — SODIUM CHLORIDE 0.9 % IR SOLN
Freq: Once | Status: DC
  Filled 2011-08-14: qty 1.2

## 2011-08-14 MED ORDER — HEPARIN SOD (PORK) LOCK FLUSH 100 UNIT/ML IV SOLN
INTRAVENOUS | Status: AC
  Filled 2011-08-14: qty 5

## 2011-08-14 MED ORDER — BUPIVACAINE-EPINEPHRINE 0.25% -1:200000 IJ SOLN
INTRAMUSCULAR | Status: DC | PRN
  Administered 2011-08-14: 20 mL

## 2011-08-14 MED ORDER — BUPIVACAINE-EPINEPHRINE PF 0.25-1:200000 % IJ SOLN
INTRAMUSCULAR | Status: AC
  Filled 2011-08-14: qty 30

## 2011-08-14 MED ORDER — FENTANYL CITRATE 0.05 MG/ML IJ SOLN
INTRAMUSCULAR | Status: DC | PRN
  Administered 2011-08-14: 100 ug via INTRAVENOUS
  Administered 2011-08-14: 50 ug via INTRAVENOUS

## 2011-08-14 SURGICAL SUPPLY — 37 items
BAG DECANTER FOR FLEXI CONT (MISCELLANEOUS) ×2 IMPLANT
BENZOIN TINCTURE PRP APPL 2/3 (GAUZE/BANDAGES/DRESSINGS) IMPLANT
BLADE SURG 15 STRL LF DISP TIS (BLADE) ×1 IMPLANT
BLADE SURG 15 STRL SS (BLADE) ×1
CLOTH BEACON ORANGE TIMEOUT ST (SAFETY) ×2 IMPLANT
DECANTER SPIKE VIAL GLASS SM (MISCELLANEOUS) ×2 IMPLANT
DERMABOND ADVANCED (GAUZE/BANDAGES/DRESSINGS) ×1
DERMABOND ADVANCED .7 DNX12 (GAUZE/BANDAGES/DRESSINGS) ×1 IMPLANT
DRAPE C-ARM 42X72 X-RAY (DRAPES) ×2 IMPLANT
DRAPE LAPAROSCOPIC ABDOMINAL (DRAPES) ×2 IMPLANT
DRAPE UTILITY XL STRL (DRAPES) ×2 IMPLANT
DRSG TEGADERM 6X8 (GAUZE/BANDAGES/DRESSINGS) IMPLANT
ELECT REM PT RETURN 9FT ADLT (ELECTROSURGICAL) ×2
ELECTRODE REM PT RTRN 9FT ADLT (ELECTROSURGICAL) ×1 IMPLANT
GAUZE SPONGE 4X4 16PLY XRAY LF (GAUZE/BANDAGES/DRESSINGS) ×2 IMPLANT
GLOVE BIO SURGEON STRL SZ7.5 (GLOVE) ×2 IMPLANT
GLOVE INDICATOR 8.0 STRL GRN (GLOVE) ×2 IMPLANT
GOWN STRL NON-REIN LRG LVL3 (GOWN DISPOSABLE) ×2 IMPLANT
GOWN STRL REIN XL XLG (GOWN DISPOSABLE) ×4 IMPLANT
KIT BASIN OR (CUSTOM PROCEDURE TRAY) ×2 IMPLANT
KIT POWER CATH 8FR (Catheter) ×2 IMPLANT
NEEDLE HYPO 22GX1.5 SAFETY (NEEDLE) ×2 IMPLANT
NEEDLE HYPO 25X1 1.5 SAFETY (NEEDLE) IMPLANT
NS IRRIG 1000ML POUR BTL (IV SOLUTION) ×2 IMPLANT
PACK BASIC VI WITH GOWN DISP (CUSTOM PROCEDURE TRAY) ×2 IMPLANT
PEN SKIN MARKING BROAD (MISCELLANEOUS) IMPLANT
PENCIL BUTTON HOLSTER BLD 10FT (ELECTRODE) ×2 IMPLANT
SPONGE GAUZE 4X4 12PLY (GAUZE/BANDAGES/DRESSINGS) IMPLANT
STRIP CLOSURE SKIN 1/2X4 (GAUZE/BANDAGES/DRESSINGS) IMPLANT
SUT MNCRL AB 4-0 PS2 18 (SUTURE) ×2 IMPLANT
SUT PROLENE 2 0 SH DA (SUTURE) ×2 IMPLANT
SUT VIC AB 3-0 SH 27 (SUTURE) ×1
SUT VIC AB 3-0 SH 27XBRD (SUTURE) ×1 IMPLANT
SYR BULB IRRIGATION 50ML (SYRINGE) IMPLANT
SYR CONTROL 10ML LL (SYRINGE) ×2 IMPLANT
SYRINGE 10CC LL (SYRINGE) ×2 IMPLANT
TOWEL OR 17X26 10 PK STRL BLUE (TOWEL DISPOSABLE) ×2 IMPLANT

## 2011-08-14 NOTE — H&P (Signed)
No chief complaint on file.   HISTORY: 51yo AAM with h/o uT3N0 rectal cancer s/p neoadjuvant therapy followed by LAR with ostomy reversal. Surveillance imaging and colonoscopy revealed recurrent mass in rectum along with lung mets. He presents for port placement for chemotherapy. He denies any fevers, chills, chest pain, sob, wt loss, DOE.   Past Medical History  Diagnosis Date  . Anemia   . Rectal cancer 2011    uT3N0; neoadju txt & surgery; final path - no residual cancer     Past Surgical History  Procedure Date  . Low anterior bowel resection 05/2010    Lap assist; with loop ileostomy  . Resection small bowel / closure ileostomy 10/07/2010    Current Facility-Administered Medications  Medication Dose Route Frequency Provider Last Rate Last Dose  . ceFAZolin (ANCEF) IVPB 2 g/50 mL premix  2 g Intravenous 60 min Pre-Op Atilano Ina, MD,FACS      . heparin 6,000 Units in sodium chloride irrigation 0.9 % 500 mL irrigation   Irrigation Once Atilano Ina, MD,FACS       Facility-Administered Medications Ordered in Other Encounters  Medication Dose Route Frequency Provider Last Rate Last Dose  . lactated ringers infusion    Continuous PRN Rolly Pancake, CRNA         No Known Allergies   Family History  Problem Relation Age of Onset  . Heart disease Mother   . Diabetes Mother   . Diabetes Father   . Cancer Father     colon  . Stroke Father      History   Social History  . Marital Status: Single    Spouse Name: N/A    Number of Children: 0  . Years of Education: N/A   Occupational History  . none    Social History Main Topics  . Smoking status: Never Smoker   . Smokeless tobacco: None  . Alcohol Use: No  . Drug Use: No  . Sexually Active: No   Other Topics Concern  . None   Social History Narrative  . None     REVIEW OF SYSTEMS - PERTINENT POSITIVES ONLY: Some melena intermittently  EXAM: Filed Vitals:   08/14/11 1137  BP: 143/85  Pulse:  94  Temp: 98.7 F (37.1 C)  Resp: 20    GEN:  WD, WN obese AAM in NAD HEENT: normocephalic; pupils equal and reactive; sclerae clear; dentition good; mucous membranes moist NECK:  ; symmetric on extension; no palpable anterior or posterior cervical lymphadenopathy; no supraclavicular masses; no tenderness CHEST: clear to auscultation bilaterally without rales, rhonchi, or wheezes CARDIAC: regular rate and rhythm without significant murmur; peripheral pulses are full EXT:  non-tender without edema; no deformity; symmetric strength NEURO: no gross focal deficits; no sign of tremor ABD:  Soft, nt, nd. Well healed incisions. No hernia RECTAL: deferred  LABORATORY RESULTS: See Epic for most recent results  RADIOLOGY RESULTS: See Epic or I-Site for most recent results  DATA REVIEWED: My office note   IMPRESSION: Recurrent rectal cancer with metastatic disease   PLAN: Needs port a cath for recurrent rectal cancer with mets.   Discussed risk and benefits including bleeding, infection, injury to great vessels, lungs, port complications, anesthesia complications, port infection, possible need for revision.   Mary Sella. Andrey Campanile, MD, FACS General, Bariatric, & Minimally Invasive Surgery [facility]      Visit Diagnoses: No diagnosis found.  Primary Care Physician: Alva Garnet., MD, MD

## 2011-08-14 NOTE — Discharge Instructions (Signed)
[facility] Office Phone Number [phone]   POST OP INSTRUCTIONS  Always review your discharge instruction sheet given to you by the facility where your surgery was performed.  IF YOU HAVE DISABILITY OR FAMILY LEAVE FORMS, YOU MUST BRING THEM TO THE OFFICE FOR PROCESSING.  DO NOT GIVE THEM TO YOUR DOCTOR.  1. A prescription for pain medication may be given to you upon discharge.  Take your pain medication as prescribed, if needed.  If narcotic pain medicine is not needed, then you may take acetaminophen (Tylenol) or ibuprofen (Advil) as needed. 2. Take your usually prescribed medications unless otherwise directed 3. If you need a refill on your pain medication, please contact your pharmacy.  They will contact our office to request authorization.  Prescriptions will not be filled after 5pm or on week-ends. 4. You should eat very light the first 24 hours after surgery, such as soup, crackers, pudding, etc.  Resume your normal diet the day after surgery. 5. Most patients will experience some swelling and bruising in the chest.  Ice packs will help.  Swelling and bruising can take several days to resolve.  6. It is common to experience some constipation if taking pain medication after surgery.  Increasing fluid intake and taking a stool softener will usually help or prevent this problem from occurring.  A mild laxative (Milk of Magnesia or Miralax) should be taken according to package directions if there are no bowel movements after 48 hours. 7.  If your surgeon used skin glue on the incision, you may shower in 24 hours.  The glue will flake off over the next 2-3 weeks.  Any sutures or staples will be removed at the office during your follow-up visit. 8. ACTIVITIES:  You may resume regular daily activities (gradually increasing) beginning the next day.   a. You may drive when you no longer are taking prescription pain medication, you can comfortably wear a seatbelt, and you can safely  maneuver your car and apply brakes. b. RETURN TO WORK:   9. You should see your doctor in the office for a follow-up appointment approximately if there are concerns about the incision.   OTHER INSTRUCTIONS: *** WHEN TO CALL YOUR DOCTOR: 1. Fever over 101.0 2. Nausea and/or vomiting. 3. Shortness of breath/difficulty breathing. 4. Continued bleeding from incision. 5. Increased pain, redness, or drainage from the incision.  The clinic staff is available to answer your questions during regular business hours.  Please don't hesitate to call and ask to speak to one of the nurses for clinical concerns.  If you have a medical emergency, go to the nearest emergency room or call 911.  A surgeon from [facility] is always on call at the hospital.  For further questions, please visit [email or web address]

## 2011-08-14 NOTE — Transfer of Care (Signed)
Immediate Anesthesia Transfer of Care Note  Patient: [name]  Procedure(s) Performed: Procedure(s) (LRB): INSERTION PORT-A-CATH (Right)  Patient Location: PACU  Anesthesia Type: General  Level of Consciousness: awake, sedated and patient cooperative  Airway & Oxygen Therapy: Patient Spontanous Breathing and Patient connected to face mask oxygen  Post-op Assessment: Report given to PACU RN and Post -op Vital signs reviewed and stable  Post vital signs: Reviewed and stable  Complications: No apparent anesthesia complications

## 2011-08-14 NOTE — Op Note (Signed)
[name] [facility] 09-Mar-1961 [id number] 08/06/2011  Preoperative diagnosis: PAC needed  Postoperative diagnosis: Same  Procedure: Portacath Placement  Surgeon: Mary Sella. Andrey Campanile, MD, FACS  Anesthesia: General and 20cc of 0.25% marcaine with epi  Clinical History and Indications: The patient is getting ready to begin chemotherapy for his cancer. He  needs a Port-A-Cath for venous access.  Description of Procedure: I have seen the patient in the holding area and confirmed the plans for the procedure as noted above. I reviewed the risks and complications again and the patient has no further questions.  The patient was then taken to the operating room. After satisfactory general LMA anesthesia had been obtained the upper chest and lower neck were prepped and draped as a sterile field. The timeout was done.  The right subclavian vein was entered and the guidewire threaded into the superior vena cava right atrial area under fluoroscopic guidance. An incision was then made on the anterior chest wall and a subcutaneous pocket fashioned for the port reservoir.  The port tubing was then brought through a subcutaneous tunnel from the port site to the guidewire site. The reservoir was attached and the locking mechanism engaged. I then placed the port tubing on the chest wall over the guidewire using fluoroscopic guidance to determine the appropriate length to cut the port tubing at so that the tip was at the superior vena cava right atrial junction area. The dilator and peel-away sheath were then advanced over the guidewire while monitoring this with fluoroscopy. The guidewire and dilator were removed and the tubing was threaded and the peel-away sheath was then removed. The catheter aspirated and flushed easily.  It aspirated and flushed easily.  A final check with fluoroscopy was done to make sure we had no kinks and good positioning of the tip of the catheter. The catheter was no longer in the SVC - it was in  the contralateral subclavian vein.    I disconnected the port reservoir from the tubing. I then threaded the guidewire thru the port tubing. Under fluoro, the port tubing and guidewire were then slowly pulled back to the midline. I was then able to advance the guidewire back down into to SVC/RA area. I then advance the port tubing over the guidewire. The guidewire was then removed and the port tubing remained in the SVC/RA area.  The reservoir was secured to the fascia with 2 sutures of 2-0 Prolene. A final check with fluoroscopy was done to make sure we had no kinks and good positioning of the tip of the catheter. Everything appeared to be okay. The catheter was aspirated, flushed with dilute heparin and then concentrated aqueous heparin. Local was infiltrated into the subcutaneous tissue and deep dermis.  The incision was then closed with interrupted 2-0 Vicryl, and 4-0 Monocryl subcuticular with Dermabond on the skin.  There were no operative complications. Estimated blood loss was minimal. All counts were correct. The patient tolerated the procedure well.  Atilano Ina, MD, FACS 08/14/2011 2:35 PM

## 2011-08-14 NOTE — Interval H&P Note (Signed)
History and Physical Interval Note:  08/14/2011 1:06 PM  [name]  has presented today for surgery, with the diagnosis of rectal cancer  The various methods of treatment have been discussed with the patient and family. After consideration of risks, benefits and other options for treatment, the patient has consented to  Procedure(s) (LRB): INSERTION PORT-A-CATH (N/A) as a surgical intervention .  The patients' history has been reviewed, patient examined, no change in status, stable for surgery.  I have reviewed the patients' chart and labs.  Questions were answered to the patient's satisfaction.    Mary Sella. Andrey Campanile, MD, FACS General, Bariatric, & Minimally Invasive Surgery [facility] [place]   [facility]

## 2011-08-14 NOTE — Anesthesia Postprocedure Evaluation (Signed)
  Anesthesia Post-op Note  Patient: [name]  Procedure(s) Performed: Procedure(s) (LRB): INSERTION PORT-A-CATH (Right)  Patient Location: PACU  Anesthesia Type: General  Level of Consciousness: awake and alert   Airway and Oxygen Therapy: Patient Spontanous Breathing  Post-op Pain: mild  Post-op Assessment: Post-op Vital signs reviewed, Patient's Cardiovascular Status Stable, Respiratory Function Stable, Patent Airway and No signs of Nausea or vomiting  Post-op Vital Signs: stable  Complications: No apparent anesthesia complications

## 2011-08-14 NOTE — Anesthesia Preprocedure Evaluation (Signed)
Anesthesia Evaluation  Patient identified by MRN, date of birth, ID band Patient awake    Reviewed: Allergy & Precautions, H&P , NPO status , Patient's Chart, lab work & pertinent test results  Airway Mallampati: II TM Distance: >3 FB Neck ROM: full    Dental No notable dental hx. (+) Teeth Intact and Dental Advisory Given   Pulmonary neg pulmonary ROS,  clear to auscultation  Pulmonary exam normal       Cardiovascular Exercise Tolerance: Good neg cardio ROS regular Normal    Neuro/Psych Negative Neurological ROS  Negative Psych ROS   GI/Hepatic negative GI ROS, Neg liver ROS,   Endo/Other  Negative Endocrine ROS  Renal/GU negative Renal ROS  Genitourinary negative   Musculoskeletal   Abdominal   Peds  Hematology negative hematology ROS (+)   Anesthesia Other Findings   Reproductive/Obstetrics negative OB ROS                           Anesthesia Physical Anesthesia Plan  ASA: II  Anesthesia Plan: General   Post-op Pain Management:    Induction: Intravenous  Airway Management Planned: LMA  Additional Equipment:   Intra-op Plan:   Post-operative Plan:   Informed Consent: I have reviewed the patients History and Physical, chart, labs and discussed the procedure including the risks, benefits and alternatives for the proposed anesthesia with the patient or authorized representative who has indicated his/her understanding and acceptance.   Dental Advisory Given  Plan Discussed with: CRNA and Surgeon  Anesthesia Plan Comments:         Anesthesia Quick Evaluation

## 2011-08-16 ENCOUNTER — Other Ambulatory Visit: Payer: Self-pay | Admitting: Oncology

## 2011-08-18 ENCOUNTER — Ambulatory Visit (HOSPITAL_BASED_OUTPATIENT_CLINIC_OR_DEPARTMENT_OTHER): Payer: Medicare Other | Admitting: Oncology

## 2011-08-18 ENCOUNTER — Other Ambulatory Visit: Payer: Self-pay | Admitting: *Deleted

## 2011-08-18 DIAGNOSIS — Z85048 Personal history of other malignant neoplasm of rectum, rectosigmoid junction, and anus: Secondary | ICD-10-CM

## 2011-08-18 DIAGNOSIS — C20 Malignant neoplasm of rectum: Secondary | ICD-10-CM

## 2011-08-18 MED ORDER — PROCHLORPERAZINE MALEATE 10 MG PO TABS: 10.0000 mg | ORAL_TABLET | Freq: Four times a day (QID) | ORAL | Status: AC | PRN

## 2011-08-18 MED ORDER — LIDOCAINE-PRILOCAINE 2.5-2.5 % EX CREA: TOPICAL_CREAM | CUTANEOUS | Status: DC | PRN

## 2011-08-18 NOTE — Progress Notes (Signed)
Gave patient,sister and mother printed info and demonstration on how to use EMLA cream

## 2011-08-18 NOTE — Progress Notes (Signed)
OFFICE PROGRESS NOTE   INTERVAL HISTORY:   He returns as scheduled. He has no new complaint. He continues to have intermittent rectal bleeding. He denies shortness of breath and cough.  Dr. Andrey Campanile place a Port-A-Cath on 08/14/2011. He reports tolerating the procedure well.  Objective:  Vital signs in last 24 hours:  There were no vitals taken for this visit.    HEENT: Neck without mass Lymphatics: No cervical, supraclavicular, axillary, or inguinal nodes Resp: Lungs clear bilaterally Cardio: Regular rate and rhythm GI: No hepatosplenomegaly, no mass Vascular: The left lower leg is larger than the right side, no edema    Portacath/PICC-without erythema, resolving ecchymosis  Lab Results:  Lab Results  Component Value Date   WBC 4.4 08/07/2011   HGB 13.1 08/07/2011   HCT 38.3* 08/07/2011   MCV 91.0 08/07/2011   PLT 145 08/07/2011   ANC 3.8 CEA 3.7  X-rays: A CT the chest on 08/06/2011-numerous pulmonary nodules throughout the bilateral lungs. The nodules have increased in size compared to a CT of the abdomen 06/13/2011. Several other nodules demonstrate internal areas of cavitation. No pleural effusion.  Medications: I have reviewed the patient's current medications.  Assessment/Plan: 1. Rectal cancer, clinical uT3 uN0, status post concurrent chemotherapy/radiation with radiation completed March 11, 2010. He is status post laparoscopic-assisted low anterior resection with diverting loop ileostomy 05/30/2010 with final pathology showing no evidence of malignancy. He declined adjuvant chemotherapy.  2. History of sigmoid colon polyp at 20.0 cm status post biopsy confirming high-grade dysplasia.  3. History of mild anemia likely related to chemotherapy/radiation and rectal bleeding.  4. Mental disability.  5. History of bilateral leg edema status post negative Doppler of the left leg December 19, 2009.  6. Family history of multiple cancers.  7. Colonoscopy 07/14/2011 with  finding of a distal rectal mass status post biopsy with pathology showing invasive colorectal adenocarcinoma, moderately differentiated. Biopsy of a polyp in the ascending colon showed a tubular adenoma.  8. abdomen/pelvis CT 06/13/2011 with interval development of multiple pulmonary nodules in both lung bases. Multiple bilateral lung nodules confirmed on a CT of the chest 08/06/2011 9. Status post Port-A-Cath placement 08/14/2011  Disposition:  He appears to have metastatic rectal cancer involving multiple bilateral lung nodules. We discussed the indication for proceeding with a lung biopsy. We decided against this given the very high likelihood of metastatic rectal cancer involving the lungs and the biopsy-proven adenocarcinoma at the rectum.  I recommend treatment with the FOLFOX chemotherapy regimen. He has attended chemotherapy teaching class. We again reviewed the potential toxicities associated with this chemotherapy regimen including the chance of hematologic toxicity and the various types of neuropathy associated with oxaliplatin.  I also recommended the addition of Avastin. I reviewed the potential toxicities associated with Avastin including a chance of developing hypertension, nosebleeds, venous/arterial thromboembolic disease, bowel perforation, and delayed wound healing. The patient and his family decided against Avastin therapy.  He agrees to proceed with FOLFOX. He is scheduled for a first cycle of chemotherapy on 08/20/2011. He will return for an office visit and the second cycle of chemotherapy on 09/03/2011. The patient and his family were instructed in the use of EMLA cream today.   Lucile Shutters, MD  08/18/2011  2:04 PM

## 2011-08-20 ENCOUNTER — Ambulatory Visit (HOSPITAL_BASED_OUTPATIENT_CLINIC_OR_DEPARTMENT_OTHER): Payer: Medicare Other

## 2011-08-20 VITALS — BP 119/75 | HR 55 | Temp 99.0°F

## 2011-08-20 DIAGNOSIS — Z85048 Personal history of other malignant neoplasm of rectum, rectosigmoid junction, and anus: Secondary | ICD-10-CM

## 2011-08-20 DIAGNOSIS — C20 Malignant neoplasm of rectum: Secondary | ICD-10-CM

## 2011-08-20 DIAGNOSIS — Z5111 Encounter for antineoplastic chemotherapy: Secondary | ICD-10-CM

## 2011-08-20 MED ORDER — OXALIPLATIN CHEMO INJECTION 100 MG/20ML
85.0000 mg/m2 | Freq: Once | INTRAVENOUS | Status: AC
  Administered 2011-08-20: 215 mg via INTRAVENOUS
  Filled 2011-08-20: qty 43

## 2011-08-20 MED ORDER — DEXAMETHASONE SODIUM PHOSPHATE 10 MG/ML IJ SOLN
10.0000 mg | Freq: Once | INTRAMUSCULAR | Status: AC
  Administered 2011-08-20: 10 mg via INTRAVENOUS

## 2011-08-20 MED ORDER — DEXTROSE 5 % IV SOLN
Freq: Once | INTRAVENOUS | Status: AC
  Administered 2011-08-20: 11:00:00 via INTRAVENOUS

## 2011-08-20 MED ORDER — FLUOROURACIL CHEMO INJECTION 2.5 GM/50ML
400.0000 mg/m2 | Freq: Once | INTRAVENOUS | Status: AC
  Administered 2011-08-20: 1000 mg via INTRAVENOUS
  Filled 2011-08-20: qty 20

## 2011-08-20 MED ORDER — SODIUM CHLORIDE 0.9 % IV SOLN
2400.0000 mg/m2 | INTRAVENOUS | Status: DC
  Administered 2011-08-20: 6000 mg via INTRAVENOUS
  Filled 2011-08-20: qty 120

## 2011-08-20 MED ORDER — ONDANSETRON 8 MG/50ML IVPB (CHCC)
8.0000 mg | Freq: Once | INTRAVENOUS | Status: AC
  Administered 2011-08-20: 8 mg via INTRAVENOUS

## 2011-08-20 MED ORDER — LEUCOVORIN CALCIUM INJECTION 350 MG
400.0000 mg/m2 | Freq: Once | INTRAVENOUS | Status: AC
  Administered 2011-08-20: 1004 mg via INTRAVENOUS
  Filled 2011-08-20: qty 50.2

## 2011-08-21 ENCOUNTER — Telehealth: Payer: Self-pay

## 2011-08-21 NOTE — Telephone Encounter (Signed)
Spoke with pt's mother re: 1st chemo yesterday.  Per Ms Massie Maroon, "Trey Paula is doing wonderfully."  Reports he has good po intake this am, and slept well last pm. Aware of how to use anti-emetics & of appt for pump d/c tomorrow.  Ms Dimaggio verbalizes procedure for reaching a nurse or the on call MD after hours.  She verbalizes appreciation for the call and states "Trey Paula will be tickled to know you checked on him."  dph

## 2011-08-22 ENCOUNTER — Ambulatory Visit (HOSPITAL_BASED_OUTPATIENT_CLINIC_OR_DEPARTMENT_OTHER): Payer: Medicare Other

## 2011-08-22 VITALS — BP 110/73 | HR 55 | Temp 98.9°F

## 2011-08-22 DIAGNOSIS — Z469 Encounter for fitting and adjustment of unspecified device: Secondary | ICD-10-CM

## 2011-08-22 DIAGNOSIS — Z85048 Personal history of other malignant neoplasm of rectum, rectosigmoid junction, and anus: Secondary | ICD-10-CM

## 2011-08-22 MED ORDER — SODIUM CHLORIDE 0.9 % IJ SOLN
10.0000 mL | INTRAMUSCULAR | Status: DC | PRN
  Administered 2011-08-22: 10 mL
  Filled 2011-08-22: qty 10

## 2011-08-22 MED ORDER — HEPARIN SOD (PORK) LOCK FLUSH 100 UNIT/ML IV SOLN
500.0000 [IU] | Freq: Once | INTRAVENOUS | Status: AC | PRN
  Administered 2011-08-22: 500 [IU]
  Filled 2011-08-22: qty 5

## 2011-08-25 ENCOUNTER — Encounter (HOSPITAL_COMMUNITY): Payer: Self-pay | Admitting: General Surgery

## 2011-08-31 ENCOUNTER — Other Ambulatory Visit: Payer: Self-pay | Admitting: Oncology

## 2011-09-03 ENCOUNTER — Ambulatory Visit (HOSPITAL_BASED_OUTPATIENT_CLINIC_OR_DEPARTMENT_OTHER): Payer: Medicare Other

## 2011-09-03 ENCOUNTER — Ambulatory Visit (HOSPITAL_BASED_OUTPATIENT_CLINIC_OR_DEPARTMENT_OTHER): Payer: Medicare Other | Admitting: Nurse Practitioner

## 2011-09-03 ENCOUNTER — Other Ambulatory Visit (HOSPITAL_BASED_OUTPATIENT_CLINIC_OR_DEPARTMENT_OTHER): Payer: Medicare Other | Admitting: Lab

## 2011-09-03 ENCOUNTER — Telehealth: Payer: Self-pay | Admitting: Oncology

## 2011-09-03 VITALS — BP 122/79 | HR 66 | Temp 97.1°F | Wt 264.3 lb

## 2011-09-03 DIAGNOSIS — C20 Malignant neoplasm of rectum: Secondary | ICD-10-CM

## 2011-09-03 DIAGNOSIS — Z85048 Personal history of other malignant neoplasm of rectum, rectosigmoid junction, and anus: Secondary | ICD-10-CM

## 2011-09-03 DIAGNOSIS — C7800 Secondary malignant neoplasm of unspecified lung: Secondary | ICD-10-CM

## 2011-09-03 DIAGNOSIS — Z5111 Encounter for antineoplastic chemotherapy: Secondary | ICD-10-CM

## 2011-09-03 LAB — COMPREHENSIVE METABOLIC PANEL
ALT: 9 U/L (ref 0–53)
BUN: 19 mg/dL (ref 6–23)
CO2: 28 mEq/L (ref 19–32)
Calcium: 9.3 mg/dL (ref 8.4–10.5)
Chloride: 104 mEq/L (ref 96–112)
Creatinine, Ser: 0.99 mg/dL (ref 0.50–1.35)
Glucose, Bld: 86 mg/dL (ref 70–99)

## 2011-09-03 LAB — CBC WITH DIFFERENTIAL/PLATELET
BASO%: 1.1 % (ref 0.0–2.0)
Basophils Absolute: 0 10*3/uL (ref 0.0–0.1)
Eosinophils Absolute: 0.1 10*3/uL (ref 0.0–0.5)
HCT: 37.2 % — ABNORMAL LOW (ref 38.4–49.9)
HGB: 12.7 g/dL — ABNORMAL LOW (ref 13.0–17.1)
LYMPH%: 16.8 % (ref 14.0–49.0)
MONO#: 0.5 10*3/uL (ref 0.1–0.9)
NEUT#: 2.3 10*3/uL (ref 1.5–6.5)
NEUT%: 65.3 % (ref 39.0–75.0)
Platelets: 153 10*3/uL (ref 140–400)
WBC: 3.6 10*3/uL — ABNORMAL LOW (ref 4.0–10.3)
lymph#: 0.6 10*3/uL — ABNORMAL LOW (ref 0.9–3.3)

## 2011-09-03 MED ORDER — FLUOROURACIL CHEMO INJECTION 2.5 GM/50ML
400.0000 mg/m2 | Freq: Once | INTRAVENOUS | Status: AC
  Administered 2011-09-03: 1000 mg via INTRAVENOUS
  Filled 2011-09-03: qty 20

## 2011-09-03 MED ORDER — DEXTROSE 5 % IV SOLN
Freq: Once | INTRAVENOUS | Status: AC
  Administered 2011-09-03: 14:00:00 via INTRAVENOUS

## 2011-09-03 MED ORDER — OXALIPLATIN CHEMO INJECTION 100 MG/20ML
85.0000 mg/m2 | Freq: Once | INTRAVENOUS | Status: AC
  Administered 2011-09-03: 215 mg via INTRAVENOUS
  Filled 2011-09-03: qty 43

## 2011-09-03 MED ORDER — SODIUM CHLORIDE 0.9 % IV SOLN
2400.0000 mg/m2 | INTRAVENOUS | Status: DC
  Administered 2011-09-03: 6000 mg via INTRAVENOUS
  Filled 2011-09-03: qty 120

## 2011-09-03 MED ORDER — DEXAMETHASONE SODIUM PHOSPHATE 10 MG/ML IJ SOLN
10.0000 mg | Freq: Once | INTRAMUSCULAR | Status: AC
Start: 2011-09-03 — End: 2011-09-03
  Administered 2011-09-03: 10 mg via INTRAVENOUS

## 2011-09-03 MED ORDER — ONDANSETRON 8 MG/50ML IVPB (CHCC)
8.0000 mg | Freq: Once | INTRAVENOUS | Status: AC
  Administered 2011-09-03: 8 mg via INTRAVENOUS

## 2011-09-03 MED ORDER — LEUCOVORIN CALCIUM INJECTION 350 MG
400.0000 mg/m2 | Freq: Once | INTRAVENOUS | Status: AC
  Administered 2011-09-03: 1004 mg via INTRAVENOUS
  Filled 2011-09-03: qty 50.2

## 2011-09-03 NOTE — Progress Notes (Signed)
OFFICE PROGRESS NOTE  Interval history:  [name] is a 51 year old man with metastatic rectal cancer. He began FOLFOX chemotherapy on 08/20/2011. He is seen today for scheduled followup.  [name] reports that overall he tolerated the first cycle of FOLFOX well. He had one episode of vomiting. No diarrhea. He denies mouth sores. He did not experience cold sensitivity. He avoided cold for 5 days after the treatment. He then resumed cold food and drink.   Objective: Blood pressure 122/79, pulse 66, temperature 97.1 F (36.2 C), temperature source Oral, weight 264 lb 4.8 oz (119.886 kg).  Oropharynx is without thrush or ulceration. Lungs are clear. Regular cardiac rhythm. Port-A-Cath site is without erythema. Abdomen is soft and nontender. No hepatomegaly. Extremities are without edema. Calves soft and nontender.  Lab Results: Lab Results  Component Value Date   WBC 3.6* 09/03/2011   HGB 12.7* 09/03/2011   HCT 37.2* 09/03/2011   MCV 87.5 09/03/2011   PLT 153 09/03/2011    Chemistry:    Chemistry      Component Value Date/Time   NA 141 08/07/2011 0929   K 4.1 08/07/2011 0929   CL 105 08/07/2011 0929   CO2 25 08/07/2011 0929   BUN 14 08/07/2011 0929   CREATININE 1.25 08/07/2011 0929      Component Value Date/Time   CALCIUM 9.2 08/07/2011 0929   ALKPHOS 46 08/07/2011 0929   AST 14 08/07/2011 0929   ALT 10 08/07/2011 0929   BILITOT 0.5 08/07/2011 0929       Studies/Results: Ct Chest Wo Contrast  08/06/2011  *RADIOLOGY REPORT*  Clinical Data: Lung nodules seen on recent abdomen CT.  History of rectal cancer.  CT CHEST WITHOUT CONTRAST  Technique:  Multidetector CT imaging of the chest was performed following the standard protocol without IV contrast.  Comparison: CT of abdomen and pelvis dated 06/13/2011.  Findings:  Mediastinum: Heart size is normal. There is no significant pericardial fluid, thickening or pericardial calcification. No pathologically enlarged mediastinal or hilar lymph  nodes. Please note that accurate exclusion of hilar adenopathy is limited on noncontrast CT scans.  Esophagus is unremarkable in appearance.  Lungs/Pleura: Again noted are numerous pulmonary nodules scattered throughout the lungs bilaterally in a random distribution, with a definite craniocaudal gradient (most nodules in the lower lungs). These nodules have increased in size and number.  Example of an enlarging nodule includes a 1.2 x 0.8 cm nodule in the periphery of the right lower lobe (image 44 of series 5) which previously measured 10 mm in length. Several of the nodules now demonstrate internal areas of cavitation (example in right lower lobe on image 44 series 5).  No consolidative airspace disease.  No pleural effusions.  Upper Abdomen: Unremarkable.  Musculoskeletal: There are no aggressive appearing lytic or blastic lesions noted in the visualized portions of the skeleton.  IMPRESSION:  1.  Innumerable pulmonary nodules that have increased slightly in size and number compared to the recent prior examination, again concerning for metastatic disease.  Presence of cavitation within these nodules may suggest a squamous cell primary neoplasm. Another differential consideration would include septic emboli, however, given the well defined nature of most of these lesions and relatively small amount of interval enlargement, an infectious etiology is not strongly favored in this case.  Original Report Authenticated By: Florencia Reasons, M.D.   Dg Chest Port 1 View  08/14/2011  *RADIOLOGY REPORT*  Clinical Data: Right Port-A-Cath insertion.  PORTABLE CHEST - 1 VIEW  Comparison:  CT chest 08/06/2011 and chest radiograph 05/28/2010.  Findings: Right subclavian power port tip projects over the SVC. No pneumothorax.  Trachea is midline.  Heart size normal.  Nodular pulmonary metastatic disease is again noted.  There may be mild interstitial prominence due to low lung volumes.  IMPRESSION:  1.  Right subclavian power  port insertion without complicating feature. 2.  Nodular pulmonary metastatic disease. 3.  Mild diffuse interstitial prominence may be due to low lung volumes.  Original Report Authenticated By: Reyes Ivan, M.D.   Dg C-arm 1-60 Min-no Report  08/14/2011  CLINICAL DATA: port a cath   C-ARM 1-60 MINUTES  Fluoroscopy was utilized by the requesting physician.  No radiographic  interpretation.      Medications: I have reviewed the patient's current medications.  Assessment/Plan:  1. Rectal cancer, clinical uT3 uN0, status post concurrent chemotherapy/radiation with radiation completed March 11, 2010. He is status post laparoscopic-assisted low anterior resection with diverting loop ileostomy 05/30/2010 with final pathology showing no evidence of malignancy. He declined adjuvant chemotherapy.  2. History of sigmoid colon polyp at 20.0 cm status post biopsy confirming high-grade dysplasia.  3. History of mild anemia likely related to chemotherapy/radiation and rectal bleeding.  4. Mental disability.  5. History of bilateral leg edema status post negative Doppler of the left leg December 19, 2009.  6. Family history of multiple cancers.  7. Colonoscopy 07/14/2011 with finding of a distal rectal mass status post biopsy with pathology showing invasive colorectal adenocarcinoma, moderately differentiated. Biopsy of a polyp in the ascending colon showed a tubular adenoma.  8. CT abdomen and pelvis 06/13/2011 with interval development of multiple pulmonary nodules in both lung bases. Multiple bilateral lung nodules confirmed on a CT of the chest 08/06/2011.  9. Status post Port-A-Cath placement 08/14/2011. 10. CEA 3.7 on 08/07/2011.  Disposition-[name] appears stable. He tolerated the first cycle of FOLFOX well. Plan to proceed with cycle 2 today as scheduled. He will return for a followup visit and cycle 3 in 2 weeks. He will contact the office in the interim with any problems.  Plan reviewed  with Dr. Truett Perna.   Lonna Cobb ANP/GNP-BC

## 2011-09-03 NOTE — Telephone Encounter (Signed)
Gv pt appt for march and april2013 °

## 2011-09-03 NOTE — Patient Instructions (Signed)
Pt ambulates out of dept with mother.  Aware of appt for pump d/c. dph

## 2011-09-05 ENCOUNTER — Ambulatory Visit (HOSPITAL_BASED_OUTPATIENT_CLINIC_OR_DEPARTMENT_OTHER): Payer: Medicare Other

## 2011-09-05 VITALS — BP 132/76 | HR 76 | Temp 98.2°F

## 2011-09-05 DIAGNOSIS — C20 Malignant neoplasm of rectum: Secondary | ICD-10-CM

## 2011-09-05 DIAGNOSIS — Z85048 Personal history of other malignant neoplasm of rectum, rectosigmoid junction, and anus: Secondary | ICD-10-CM

## 2011-09-05 DIAGNOSIS — C7800 Secondary malignant neoplasm of unspecified lung: Secondary | ICD-10-CM

## 2011-09-05 MED ORDER — SODIUM CHLORIDE 0.9 % IJ SOLN
10.0000 mL | INTRAMUSCULAR | Status: DC | PRN
  Administered 2011-09-05: 10 mL
  Filled 2011-09-05: qty 10

## 2011-09-05 MED ORDER — HEPARIN SOD (PORK) LOCK FLUSH 100 UNIT/ML IV SOLN
500.0000 [IU] | Freq: Once | INTRAVENOUS | Status: AC | PRN
  Administered 2011-09-05: 500 [IU]
  Filled 2011-09-05: qty 5

## 2011-09-16 ENCOUNTER — Other Ambulatory Visit: Payer: Self-pay | Admitting: Oncology

## 2011-09-17 ENCOUNTER — Other Ambulatory Visit (HOSPITAL_BASED_OUTPATIENT_CLINIC_OR_DEPARTMENT_OTHER): Payer: Medicare Other | Admitting: Lab

## 2011-09-17 ENCOUNTER — Ambulatory Visit (HOSPITAL_BASED_OUTPATIENT_CLINIC_OR_DEPARTMENT_OTHER): Payer: Medicare Other

## 2011-09-17 ENCOUNTER — Ambulatory Visit (HOSPITAL_BASED_OUTPATIENT_CLINIC_OR_DEPARTMENT_OTHER): Payer: Medicare Other | Admitting: Nurse Practitioner

## 2011-09-17 ENCOUNTER — Other Ambulatory Visit: Payer: Self-pay | Admitting: Oncology

## 2011-09-17 VITALS — BP 138/86 | HR 70 | Temp 97.4°F | Ht 74.5 in | Wt 268.4 lb

## 2011-09-17 DIAGNOSIS — Z85048 Personal history of other malignant neoplasm of rectum, rectosigmoid junction, and anus: Secondary | ICD-10-CM

## 2011-09-17 DIAGNOSIS — C20 Malignant neoplasm of rectum: Secondary | ICD-10-CM

## 2011-09-17 DIAGNOSIS — Z5111 Encounter for antineoplastic chemotherapy: Secondary | ICD-10-CM

## 2011-09-17 LAB — CBC WITH DIFFERENTIAL/PLATELET
EOS%: 3 % (ref 0.0–7.0)
Eosinophils Absolute: 0.1 10*3/uL (ref 0.0–0.5)
MCV: 88.8 fL (ref 79.3–98.0)
MONO%: 17.2 % — ABNORMAL HIGH (ref 0.0–14.0)
NEUT#: 3 10*3/uL (ref 1.5–6.5)
RBC: 4.11 10*6/uL — ABNORMAL LOW (ref 4.20–5.82)
RDW: 13.8 % (ref 11.0–14.6)
nRBC: 0 % (ref 0–0)

## 2011-09-17 LAB — COMPREHENSIVE METABOLIC PANEL
ALT: 11 U/L (ref 0–53)
AST: 17 U/L (ref 0–37)
Alkaline Phosphatase: 50 U/L (ref 39–117)
CO2: 29 mEq/L (ref 19–32)
Sodium: 139 mEq/L (ref 135–145)
Total Bilirubin: 0.3 mg/dL (ref 0.3–1.2)
Total Protein: 7 g/dL (ref 6.0–8.3)

## 2011-09-17 MED ORDER — ONDANSETRON 8 MG/50ML IVPB (CHCC)
8.0000 mg | Freq: Once | INTRAVENOUS | Status: AC
  Administered 2011-09-17: 8 mg via INTRAVENOUS

## 2011-09-17 MED ORDER — LEUCOVORIN CALCIUM INJECTION 350 MG
398.5000 mg/m2 | Freq: Once | INTRAVENOUS | Status: AC
  Administered 2011-09-17: 1000 mg via INTRAVENOUS
  Filled 2011-09-17: qty 50

## 2011-09-17 MED ORDER — DEXTROSE 5 % IV SOLN
Freq: Once | INTRAVENOUS | Status: AC
  Administered 2011-09-17: 11:00:00 via INTRAVENOUS

## 2011-09-17 MED ORDER — OXALIPLATIN CHEMO INJECTION 100 MG/20ML
68.0000 mg/m2 | Freq: Once | INTRAVENOUS | Status: AC
  Administered 2011-09-17: 170 mg via INTRAVENOUS
  Filled 2011-09-17: qty 34

## 2011-09-17 MED ORDER — DEXAMETHASONE SODIUM PHOSPHATE 10 MG/ML IJ SOLN
10.0000 mg | Freq: Once | INTRAMUSCULAR | Status: AC
  Administered 2011-09-17: 10 mg via INTRAVENOUS

## 2011-09-17 MED ORDER — SODIUM CHLORIDE 0.9 % IV SOLN
2400.0000 mg/m2 | INTRAVENOUS | Status: DC
  Administered 2011-09-17: 6000 mg via INTRAVENOUS
  Filled 2011-09-17: qty 120

## 2011-09-17 MED ORDER — FLUOROURACIL CHEMO INJECTION 2.5 GM/50ML
400.0000 mg/m2 | Freq: Once | INTRAVENOUS | Status: AC
  Administered 2011-09-17: 1000 mg via INTRAVENOUS
  Filled 2011-09-17: qty 20

## 2011-09-17 NOTE — Progress Notes (Signed)
OFFICE PROGRESS NOTE  Interval history:  [name] returns as scheduled. He completed cycle 2 FOLFOX 09/03/2011. He denies nausea/vomiting. No diarrhea. No mouth sores. He avoids cold exposure for 5 days following treatment. He denies numbness or tingling in his hands or feet. He continues to intermittently note blood mixed with stool.   Objective: Blood pressure 138/86, pulse 70, temperature 97.4 F (36.3 C), temperature source Oral, height 6' 2.5" (1.892 m), weight 268 lb 6.4 oz (121.745 kg).  Oropharynx is without thrush or ulceration. Lungs are clear. Regular cardiac rhythm. Port-A-Cath site is without erythema. Abdomen is soft and nontender. No hepatomegaly. Extremities are without edema. Vibratory sense is intact over the fingertips bilaterally per tuning fork exam.  Lab Results: Lab Results  Component Value Date   WBC 4.4 09/17/2011   HGB 12.3* 09/17/2011   HCT 36.5* 09/17/2011   MCV 88.8 09/17/2011   PLT 102* 09/17/2011    Chemistry:    Chemistry      Component Value Date/Time   NA 140 09/03/2011 1141   K 3.8 09/03/2011 1141   CL 104 09/03/2011 1141   CO2 28 09/03/2011 1141   BUN 19 09/03/2011 1141   CREATININE 0.99 09/03/2011 1141      Component Value Date/Time   CALCIUM 9.3 09/03/2011 1141   ALKPHOS 49 09/03/2011 1141   AST 16 09/03/2011 1141   ALT 9 09/03/2011 1141   BILITOT 0.4 09/03/2011 1141       Studies/Results: No results found.  Medications: I have reviewed the patient's current medications.  Assessment/Plan:  1. Rectal cancer, clinical uT3 uN0, status post concurrent chemotherapy/radiation with radiation completed March 11, 2010. He is status post laparoscopic-assisted low anterior resection with diverting loop ileostomy 05/30/2010 with final pathology showing no evidence of malignancy. He declined adjuvant chemotherapy.  2. History of sigmoid colon polyp at 20.0 cm status post biopsy confirming high-grade dysplasia.  3. History of mild anemia likely related  to chemotherapy/radiation and rectal bleeding.  4. Mental disability.  5. History of bilateral leg edema status post negative Doppler of the left leg December 19, 2009.  6. Family history of multiple cancers.  7. Colonoscopy 07/14/2011 with finding of a distal rectal mass status post biopsy with pathology showing invasive colorectal adenocarcinoma, moderately differentiated. Biopsy of a polyp in the ascending colon showed a tubular adenoma.  8. CT abdomen and pelvis 06/13/2011 with interval development of multiple pulmonary nodules in both lung bases. Multiple bilateral lung nodules confirmed on a CT of the chest 08/06/2011.  9. Status post Port-A-Cath placement 08/14/2011.  10. CEA 3.7 on 08/07/2011.  Disposition-[name] appears stable. He has completed 2 cycles of FOLFOX chemotherapy. Plan to proceed with cycle 3 today as scheduled. He will return for a followup visit and cycle 4 FOLFOX in 2 weeks. He will contact the office in the interim with any problems.  Plan reviewed with Dr. Truett Perna.   Lonna Cobb ANP/GNP-BC

## 2011-09-19 ENCOUNTER — Ambulatory Visit (HOSPITAL_BASED_OUTPATIENT_CLINIC_OR_DEPARTMENT_OTHER): Payer: Medicare Other

## 2011-09-19 VITALS — BP 115/76 | HR 66 | Temp 97.4°F

## 2011-09-19 DIAGNOSIS — Z452 Encounter for adjustment and management of vascular access device: Secondary | ICD-10-CM

## 2011-09-19 DIAGNOSIS — C20 Malignant neoplasm of rectum: Secondary | ICD-10-CM

## 2011-09-19 MED ORDER — SODIUM CHLORIDE 0.9 % IJ SOLN
10.0000 mL | INTRAMUSCULAR | Status: DC | PRN
  Administered 2011-09-19: 10 mL via INTRAVENOUS
  Filled 2011-09-19: qty 10

## 2011-09-19 MED ORDER — HEPARIN SOD (PORK) LOCK FLUSH 100 UNIT/ML IV SOLN
500.0000 [IU] | Freq: Once | INTRAVENOUS | Status: AC
  Administered 2011-09-19: 500 [IU] via INTRAVENOUS
  Filled 2011-09-19: qty 5

## 2011-10-01 ENCOUNTER — Ambulatory Visit (HOSPITAL_BASED_OUTPATIENT_CLINIC_OR_DEPARTMENT_OTHER): Payer: Medicare Other | Admitting: Oncology

## 2011-10-01 ENCOUNTER — Ambulatory Visit (HOSPITAL_BASED_OUTPATIENT_CLINIC_OR_DEPARTMENT_OTHER): Payer: Medicare Other

## 2011-10-01 ENCOUNTER — Telehealth: Payer: Self-pay | Admitting: Oncology

## 2011-10-01 ENCOUNTER — Other Ambulatory Visit (HOSPITAL_BASED_OUTPATIENT_CLINIC_OR_DEPARTMENT_OTHER): Payer: Medicare Other | Admitting: Lab

## 2011-10-01 VITALS — BP 118/75 | HR 63 | Temp 97.2°F | Ht 74.5 in | Wt 262.8 lb

## 2011-10-01 DIAGNOSIS — D649 Anemia, unspecified: Secondary | ICD-10-CM

## 2011-10-01 DIAGNOSIS — C19 Malignant neoplasm of rectosigmoid junction: Secondary | ICD-10-CM

## 2011-10-01 DIAGNOSIS — C20 Malignant neoplasm of rectum: Secondary | ICD-10-CM

## 2011-10-01 DIAGNOSIS — Z85048 Personal history of other malignant neoplasm of rectum, rectosigmoid junction, and anus: Secondary | ICD-10-CM

## 2011-10-01 DIAGNOSIS — Z5111 Encounter for antineoplastic chemotherapy: Secondary | ICD-10-CM

## 2011-10-01 DIAGNOSIS — C7800 Secondary malignant neoplasm of unspecified lung: Secondary | ICD-10-CM

## 2011-10-01 LAB — COMPREHENSIVE METABOLIC PANEL
Albumin: 4.1 g/dL (ref 3.5–5.2)
Alkaline Phosphatase: 47 U/L (ref 39–117)
BUN: 16 mg/dL (ref 6–23)
Calcium: 9.3 mg/dL (ref 8.4–10.5)
Chloride: 103 mEq/L (ref 96–112)
Glucose, Bld: 90 mg/dL (ref 70–99)
Potassium: 3.8 mEq/L (ref 3.5–5.3)

## 2011-10-01 LAB — CBC WITH DIFFERENTIAL/PLATELET
Basophils Absolute: 0 10*3/uL (ref 0.0–0.1)
EOS%: 8 % — ABNORMAL HIGH (ref 0.0–7.0)
HCT: 37.2 % — ABNORMAL LOW (ref 38.4–49.9)
HGB: 12.8 g/dL — ABNORMAL LOW (ref 13.0–17.1)
MCH: 30.3 pg (ref 27.2–33.4)
NEUT%: 63.7 % (ref 39.0–75.0)
lymph#: 0.5 10*3/uL — ABNORMAL LOW (ref 0.9–3.3)

## 2011-10-01 MED ORDER — DEXAMETHASONE SODIUM PHOSPHATE 10 MG/ML IJ SOLN
10.0000 mg | Freq: Once | INTRAMUSCULAR | Status: AC
  Administered 2011-10-01: 10 mg via INTRAVENOUS

## 2011-10-01 MED ORDER — LEUCOVORIN CALCIUM INJECTION 350 MG
400.0000 mg/m2 | Freq: Once | INTRAVENOUS | Status: AC
  Administered 2011-10-01: 1004 mg via INTRAVENOUS
  Filled 2011-10-01: qty 50.2

## 2011-10-01 MED ORDER — ONDANSETRON 8 MG/50ML IVPB (CHCC)
8.0000 mg | Freq: Once | INTRAVENOUS | Status: AC
  Administered 2011-10-01: 8 mg via INTRAVENOUS

## 2011-10-01 MED ORDER — DEXTROSE 5 % IV SOLN
Freq: Once | INTRAVENOUS | Status: AC
  Administered 2011-10-01: 12:00:00 via INTRAVENOUS

## 2011-10-01 MED ORDER — SODIUM CHLORIDE 0.9 % IV SOLN
2400.0000 mg/m2 | INTRAVENOUS | Status: DC
  Administered 2011-10-01: 6000 mg via INTRAVENOUS
  Filled 2011-10-01: qty 120

## 2011-10-01 MED ORDER — FLUOROURACIL CHEMO INJECTION 2.5 GM/50ML
400.0000 mg/m2 | Freq: Once | INTRAVENOUS | Status: AC
  Administered 2011-10-01: 1000 mg via INTRAVENOUS
  Filled 2011-10-01: qty 20

## 2011-10-01 MED ORDER — OXALIPLATIN CHEMO INJECTION 100 MG/20ML
68.0000 mg/m2 | Freq: Once | INTRAVENOUS | Status: AC
  Administered 2011-10-01: 170 mg via INTRAVENOUS
  Filled 2011-10-01: qty 34

## 2011-10-01 NOTE — Progress Notes (Deleted)
Pt refused to take zofran po. States he took one before seeing the MD, he thought "he was told to take one when he arrived at [facility]".

## 2011-10-01 NOTE — Patient Instructions (Signed)
[facility] Discharge Instructions for Patients Receiving Chemotherapy  Today you received the following chemotherapy agents FOLFOX   To help prevent nausea and vomiting after your treatment, we encourage you to take your nausea medication  TAKE HOME MEDS Begin taking it at 7pm and take it as often as prescribed for the next {48-72 hours.   If you develop nausea and vomiting that is not controlled by your nausea medication, call the clinic. If it is after clinic hours your family physician or the after hours number for the clinic or go to the Emergency Department.   BELOW ARE SYMPTOMS THAT SHOULD BE REPORTED IMMEDIATELY:  *FEVER GREATER THAN 100.5 F  *CHILLS WITH OR WITHOUT FEVER  NAUSEA AND VOMITING THAT IS NOT CONTROLLED WITH YOUR NAUSEA MEDICATION  *UNUSUAL SHORTNESS OF BREATH  *UNUSUAL BRUISING OR BLEEDING  TENDERNESS IN MOUTH AND THROAT WITH OR WITHOUT PRESENCE OF ULCERS  *URINARY PROBLEMS  *BOWEL PROBLEMS  UNUSUAL RASH Items with * indicate a potential emergency and should be followed up as soon as possible.  One of the nurses will contact you 24 hours after your treatment. Please let the nurse know about any problems that you may have experienced. Feel free to call the clinic you have any questions or concerns. The clinic phone number is [phone].   I have been informed and understand all the instructions given to me. I know to contact the clinic, my physician, or go to the Emergency Department if any problems should occur. I do not have any questions at this time, but understand that I may call the clinic during office hours   should I have any questions or need assistance in obtaining follow up care.    __________________________________________  _____________  __________ Signature of Patient or Authorized Representative            Date                   Time    __________________________________________ Nurse's Signature

## 2011-10-01 NOTE — Telephone Encounter (Signed)
lmonvm adviisng the pt that his April 2013 appt calendar is ready to be picked up

## 2011-10-01 NOTE — Progress Notes (Signed)
OFFICE PROGRESS NOTE   INTERVAL HISTORY:   He returns as scheduled. He completed another cycle of FOLFOX on 09/17/2011. He denies mouth sores, diarrhea, and neuropathy symptoms. He reports 1 episode of emesis a few days after chemotherapy. He continues to have intermittent rectal bleeding.  Objective:  Vital signs in last 24 hours:  Blood pressure 118/75, pulse 63, temperature 97.2 F (36.2 C), temperature source Oral, height 6' 2.5" (1.892 m), weight 262 lb 12.8 oz (119.205 kg).    HEENT: No thrush or ulcers Resp: Lungs clear bilaterally Cardio: Regular rate and rhythm GI: No hepatomegaly, nontender Vascular: The left lower leg is larger than the right side, no erythema, tenderness, or edema    Portacath/PICC-without erythema  Lab Results:  Lab Results  Component Value Date   WBC 4.1 10/01/2011   HGB 12.8* 10/01/2011   HCT 37.2* 10/01/2011   MCV 88.2 10/01/2011   PLT 108* 10/01/2011   ANC 2.6    Medications: I have reviewed the patient's current medications.  Assessment/Plan: 1.Rectal cancer, clinical uT3 uN0, status post concurrent chemotherapy/radiation with radiation completed March 11, 2010. He is status post laparoscopic-assisted low anterior resection with diverting loop ileostomy 05/30/2010 with final pathology showing no evidence of malignancy. He declined adjuvant chemotherapy.  2. History of sigmoid colon polyp at 20.0 cm status post biopsy confirming high-grade dysplasia.  3. History of mild anemia likely related to chemotherapy/radiation and rectal bleeding.  4. Mental disability.  5. History of bilateral leg edema status post negative Doppler of the left leg December 19, 2009.  6. Family history of multiple cancers.  7. Colonoscopy 07/14/2011 with finding of a distal rectal mass status post biopsy with pathology showing invasive colorectal adenocarcinoma, moderately differentiated. Biopsy of a polyp in the ascending colon showed a tubular adenoma.  8. CT abdomen  and pelvis 06/13/2011 with interval development of multiple pulmonary nodules in both lung bases. Multiple bilateral lung nodules confirmed on a CT of the chest 08/06/2011.     -Initiation of FOLFOX chemotherapy 08/20/2011 9. Status post Port-A-Cath placement 08/14/2011.  10. CEA 3.7 on 08/07/2011.    Disposition:  He appears to be tolerating the chemotherapy well. The plan is to proceed with cycle 4 of FOLFOX today. He will return for an office visit and cycle 5 in 2 weeks. He will be scheduled for a restaging CT of the chest after cycle 5.   Lucile Shutters, MD  10/01/2011  3:24 PM

## 2011-10-04 ENCOUNTER — Ambulatory Visit (HOSPITAL_BASED_OUTPATIENT_CLINIC_OR_DEPARTMENT_OTHER): Payer: Medicare Other

## 2011-10-04 VITALS — BP 131/76 | HR 61 | Temp 98.9°F

## 2011-10-04 DIAGNOSIS — C20 Malignant neoplasm of rectum: Secondary | ICD-10-CM

## 2011-10-04 DIAGNOSIS — Z85048 Personal history of other malignant neoplasm of rectum, rectosigmoid junction, and anus: Secondary | ICD-10-CM

## 2011-10-04 DIAGNOSIS — C7800 Secondary malignant neoplasm of unspecified lung: Secondary | ICD-10-CM

## 2011-10-04 MED ORDER — HEPARIN SOD (PORK) LOCK FLUSH 100 UNIT/ML IV SOLN
500.0000 [IU] | Freq: Once | INTRAVENOUS | Status: AC | PRN
  Administered 2011-10-04: 500 [IU]
  Filled 2011-10-04: qty 5

## 2011-10-04 MED ORDER — SODIUM CHLORIDE 0.9 % IJ SOLN
10.0000 mL | INTRAMUSCULAR | Status: DC | PRN
  Administered 2011-10-04: 10 mL
  Filled 2011-10-04: qty 10

## 2011-10-14 ENCOUNTER — Other Ambulatory Visit: Payer: Self-pay | Admitting: Oncology

## 2011-10-15 ENCOUNTER — Ambulatory Visit (HOSPITAL_BASED_OUTPATIENT_CLINIC_OR_DEPARTMENT_OTHER): Payer: Medicare Other | Admitting: Nurse Practitioner

## 2011-10-15 ENCOUNTER — Ambulatory Visit (HOSPITAL_BASED_OUTPATIENT_CLINIC_OR_DEPARTMENT_OTHER): Payer: Medicare Other

## 2011-10-15 ENCOUNTER — Telehealth: Payer: Self-pay | Admitting: *Deleted

## 2011-10-15 ENCOUNTER — Other Ambulatory Visit: Payer: Medicare Other | Admitting: Lab

## 2011-10-15 ENCOUNTER — Telehealth: Payer: Self-pay | Admitting: Oncology

## 2011-10-15 VITALS — BP 123/87 | HR 65 | Temp 97.0°F | Ht 74.5 in | Wt 260.6 lb

## 2011-10-15 DIAGNOSIS — Z85048 Personal history of other malignant neoplasm of rectum, rectosigmoid junction, and anus: Secondary | ICD-10-CM

## 2011-10-15 DIAGNOSIS — C20 Malignant neoplasm of rectum: Secondary | ICD-10-CM

## 2011-10-15 DIAGNOSIS — Z5111 Encounter for antineoplastic chemotherapy: Secondary | ICD-10-CM

## 2011-10-15 LAB — CBC WITH DIFFERENTIAL/PLATELET
BASO%: 1.5 % (ref 0.0–2.0)
EOS%: 7.4 % — ABNORMAL HIGH (ref 0.0–7.0)
HCT: 37.1 % — ABNORMAL LOW (ref 38.4–49.9)
LYMPH%: 15.5 % (ref 14.0–49.0)
MCH: 30.3 pg (ref 27.2–33.4)
MCHC: 34.2 g/dL (ref 32.0–36.0)
NEUT%: 59.1 % (ref 39.0–75.0)
Platelets: 114 10*3/uL — ABNORMAL LOW (ref 140–400)
RBC: 4.19 10*6/uL — ABNORMAL LOW (ref 4.20–5.82)

## 2011-10-15 LAB — COMPREHENSIVE METABOLIC PANEL
AST: 17 U/L (ref 0–37)
Alkaline Phosphatase: 44 U/L (ref 39–117)
BUN: 14 mg/dL (ref 6–23)
Calcium: 9.3 mg/dL (ref 8.4–10.5)
Creatinine, Ser: 0.93 mg/dL (ref 0.50–1.35)
Total Bilirubin: 0.3 mg/dL (ref 0.3–1.2)

## 2011-10-15 MED ORDER — OXALIPLATIN CHEMO INJECTION 100 MG/20ML
68.0000 mg/m2 | Freq: Once | INTRAVENOUS | Status: AC
  Administered 2011-10-15: 170 mg via INTRAVENOUS
  Filled 2011-10-15: qty 34

## 2011-10-15 MED ORDER — DEXAMETHASONE SODIUM PHOSPHATE 10 MG/ML IJ SOLN
10.0000 mg | Freq: Once | INTRAMUSCULAR | Status: AC
  Administered 2011-10-15: 10 mg via INTRAVENOUS

## 2011-10-15 MED ORDER — FLUOROURACIL CHEMO INJECTION 2.5 GM/50ML
400.0000 mg/m2 | Freq: Once | INTRAVENOUS | Status: AC
  Administered 2011-10-15: 1000 mg via INTRAVENOUS
  Filled 2011-10-15: qty 20

## 2011-10-15 MED ORDER — DEXTROSE 5 % IV SOLN
Freq: Once | INTRAVENOUS | Status: AC
  Administered 2011-10-15: 11:00:00 via INTRAVENOUS

## 2011-10-15 MED ORDER — LEUCOVORIN CALCIUM INJECTION 350 MG
400.0000 mg/m2 | Freq: Once | INTRAVENOUS | Status: AC
  Administered 2011-10-15: 1004 mg via INTRAVENOUS
  Filled 2011-10-15: qty 50.2

## 2011-10-15 MED ORDER — SODIUM CHLORIDE 0.9 % IV SOLN
2400.0000 mg/m2 | INTRAVENOUS | Status: DC
  Administered 2011-10-15: 6000 mg via INTRAVENOUS
  Filled 2011-10-15: qty 120

## 2011-10-15 MED ORDER — ONDANSETRON 8 MG/50ML IVPB (CHCC)
8.0000 mg | Freq: Once | INTRAVENOUS | Status: AC
  Administered 2011-10-15: 8 mg via INTRAVENOUS

## 2011-10-15 NOTE — Telephone Encounter (Signed)
Gave pt appt calendar for May , lab CT, md and chemo, pt instructed to be NPO 4 hrs prior to CT

## 2011-10-15 NOTE — Telephone Encounter (Signed)
Per staff message from [place], I have scheduled the patient for treatment. Rose advised appt in computer. JMW

## 2011-10-15 NOTE — Patient Instructions (Signed)
[facility] Discharge Instructions for Patients Receiving Chemotherapy  Today you received the following chemotherapy agents Oxaliplatin/Leucovorin/5FU. To help prevent nausea and vomiting after your treatment, we encourage you to take your nausea medication as ordered per MD.    If you develop nausea and vomiting that is not controlled by your nausea medication, call the clinic. If it is after clinic hours your family physician or the after hours number for the clinic or go to the Emergency Department.   BELOW ARE SYMPTOMS THAT SHOULD BE REPORTED IMMEDIATELY:  *FEVER GREATER THAN 100.5 F  *CHILLS WITH OR WITHOUT FEVER  NAUSEA AND VOMITING THAT IS NOT CONTROLLED WITH YOUR NAUSEA MEDICATION  *UNUSUAL SHORTNESS OF BREATH  *UNUSUAL BRUISING OR BLEEDING  TENDERNESS IN MOUTH AND THROAT WITH OR WITHOUT PRESENCE OF ULCERS  *URINARY PROBLEMS  *BOWEL PROBLEMS  UNUSUAL RASH Items with * indicate a potential emergency and should be followed up as soon as possible.  One of the nurses will contact you 24 hours after your treatment. Please let the nurse know about any problems that you may have experienced. Feel free to call the clinic you have any questions or concerns. The clinic phone number is [phone].   I have been informed and understand all the instructions given to me. I know to contact the clinic, my physician, or go to the Emergency Department if any problems should occur. I do not have any questions at this time, but understand that I may call the clinic during office hours   should I have any questions or need assistance in obtaining follow up care.  Return to [facility] on Friday-10/17/11 at  2:15PM.        __________________________________________  _____________  __________ Signature of Patient or Authorized Representative            Date                   Time    __________________________________________ Nurse's Signature

## 2011-10-15 NOTE — Progress Notes (Signed)
OFFICE PROGRESS NOTE  Interval history:  [name] returns as scheduled. He completed cycle 4 FOLFOX on 10/01/2011. He denies nausea/vomiting. No mouth sores. No diarrhea. No numbness or tingling in his hands or feet. He has a good appetite and overall good energy level.   Objective: Blood pressure 123/87, pulse 65, temperature 97 F (36.1 C), temperature source Oral, height 6' 2.5" (1.892 m), weight 260 lb 9.6 oz (118.207 kg).  Oropharynx is without thrush or ulceration. Mucous membranes are pink and moist. Lungs are clear. No wheezes or rales. Regular cardiac rhythm. Port-A-Cath site is without erythema. Abdomen is soft and nontender. No hepatomegaly. Extremities are without edema. Calves are soft and nontender. Vibratory sense is intact over the fingertips per tuning fork exam.  Lab Results: Lab Results  Component Value Date   WBC 3.9* 10/15/2011   HGB 12.7* 10/15/2011   HCT 37.1* 10/15/2011   MCV 88.5 10/15/2011   PLT 114* 10/15/2011    Chemistry:    Chemistry      Component Value Date/Time   NA 139 10/01/2011 1011   K 3.8 10/01/2011 1011   CL 103 10/01/2011 1011   CO2 28 10/01/2011 1011   BUN 16 10/01/2011 1011   CREATININE 0.91 10/01/2011 1011      Component Value Date/Time   CALCIUM 9.3 10/01/2011 1011   ALKPHOS 47 10/01/2011 1011   AST 17 10/01/2011 1011   ALT 11 10/01/2011 1011   BILITOT 0.4 10/01/2011 1011       Studies/Results: No results found.  Medications: I have reviewed the patient's current medications.  Assessment/Plan:  1. Rectal cancer, clinical uT3 uN0, status post concurrent chemotherapy/radiation with radiation completed March 11, 2010. He is status post laparoscopic-assisted low anterior resection with diverting loop ileostomy 05/30/2010 with final pathology showing no evidence of malignancy. He declined adjuvant chemotherapy.  2. History of sigmoid colon polyp at 20.0 cm status post biopsy confirming high-grade dysplasia.  3. History of mild anemia  likely related to chemotherapy/radiation and rectal bleeding.  4. Mental disability.  5. History of bilateral leg edema status post negative Doppler of the left leg December 19, 2009.  6. Family history of multiple cancers.  7. Colonoscopy 07/14/2011 with finding of a distal rectal mass status post biopsy with pathology showing invasive colorectal adenocarcinoma, moderately differentiated. Biopsy of a polyp in the ascending colon showed a tubular adenoma.  8. CT abdomen and pelvis 06/13/2011 with interval development of multiple pulmonary nodules in both lung bases. Multiple bilateral lung nodules confirmed on a CT of the chest 08/06/2011. Chemotherapy initiated with FOLFOX on 08/20/2011. 9. Status post Port-A-Cath placement 08/14/2011.  10. CEA 3.7 on 08/07/2011.  Disposition-[name] appears stable. He has completed 4 cycles of FOLFOX chemotherapy. Plan to proceed with cycle 5 today as scheduled. We are referring him for a restaging CT scan of the chest on 10/27/2011. He will return for a followup visit with Dr. Truett Perna on 10/29/2011. He will contact the office in the interim with any problems.  Plan reviewed with Dr. Truett Perna.   Lonna Cobb ANP/GNP-BC

## 2011-10-17 ENCOUNTER — Ambulatory Visit (HOSPITAL_BASED_OUTPATIENT_CLINIC_OR_DEPARTMENT_OTHER): Payer: Medicare Other

## 2011-10-17 VITALS — BP 118/75 | HR 61 | Temp 97.0°F

## 2011-10-17 DIAGNOSIS — Z452 Encounter for adjustment and management of vascular access device: Secondary | ICD-10-CM

## 2011-10-17 DIAGNOSIS — C20 Malignant neoplasm of rectum: Secondary | ICD-10-CM

## 2011-10-17 DIAGNOSIS — Z85048 Personal history of other malignant neoplasm of rectum, rectosigmoid junction, and anus: Secondary | ICD-10-CM

## 2011-10-17 MED ORDER — SODIUM CHLORIDE 0.9 % IJ SOLN
10.0000 mL | INTRAMUSCULAR | Status: DC | PRN
  Filled 2011-10-17: qty 10

## 2011-10-17 MED ORDER — HEPARIN SOD (PORK) LOCK FLUSH 100 UNIT/ML IV SOLN
500.0000 [IU] | Freq: Once | INTRAVENOUS | Status: AC
  Administered 2011-10-17: 500 [IU] via INTRAVENOUS
  Filled 2011-10-17: qty 5

## 2011-10-17 MED ORDER — SODIUM CHLORIDE 0.9 % IJ SOLN
3.0000 mL | INTRAMUSCULAR | Status: DC | PRN
  Administered 2011-10-17: 3 mL via INTRAVENOUS
  Filled 2011-10-17: qty 10

## 2011-10-17 MED ORDER — HEPARIN SOD (PORK) LOCK FLUSH 100 UNIT/ML IV SOLN
500.0000 [IU] | Freq: Once | INTRAVENOUS | Status: DC | PRN
  Filled 2011-10-17: qty 5

## 2011-10-27 ENCOUNTER — Ambulatory Visit (HOSPITAL_COMMUNITY)
Admission: RE | Admit: 2011-10-27 | Discharge: 2011-10-27 | Disposition: A | Discharge status: Home / Self Care | Payer: Medicare Other | Source: Ambulatory Visit | Attending: Nurse Practitioner | Admitting: Nurse Practitioner

## 2011-10-27 ENCOUNTER — Other Ambulatory Visit (HOSPITAL_BASED_OUTPATIENT_CLINIC_OR_DEPARTMENT_OTHER): Payer: Medicare Other | Admitting: Lab

## 2011-10-27 DIAGNOSIS — C20 Malignant neoplasm of rectum: Secondary | ICD-10-CM | POA: Insufficient documentation

## 2011-10-27 DIAGNOSIS — R911 Solitary pulmonary nodule: Secondary | ICD-10-CM | POA: Insufficient documentation

## 2011-10-27 LAB — CBC WITH DIFFERENTIAL/PLATELET
Basophils Absolute: 0 10*3/uL (ref 0.0–0.1)
EOS%: 6.3 % (ref 0.0–7.0)
Eosinophils Absolute: 0.2 10*3/uL (ref 0.0–0.5)
HGB: 12 g/dL — ABNORMAL LOW (ref 13.0–17.1)
NEUT#: 2.3 10*3/uL (ref 1.5–6.5)
RBC: 3.83 10*6/uL — ABNORMAL LOW (ref 4.20–5.82)
RDW: 15.9 % — ABNORMAL HIGH (ref 11.0–14.6)
lymph#: 0.4 10*3/uL — ABNORMAL LOW (ref 0.9–3.3)
nRBC: 0 % (ref 0–0)

## 2011-10-27 LAB — CMP (CANCER CENTER ONLY)
ALT(SGPT): 26 U/L (ref 10–47)
AST: 31 U/L (ref 11–38)
Albumin: 3.7 g/dL (ref 3.3–5.5)
Alkaline Phosphatase: 41 U/L (ref 26–84)
Calcium: 9 mg/dL (ref 8.0–10.3)
Chloride: 98 mEq/L (ref 98–108)
Potassium: 4.4 mEq/L (ref 3.3–4.7)
Sodium: 141 mEq/L (ref 128–145)

## 2011-10-27 MED ORDER — IOHEXOL 300 MG/ML  SOLN
80.0000 mL | Freq: Once | INTRAMUSCULAR | Status: AC | PRN
  Administered 2011-10-27: 80 mL via INTRAVENOUS

## 2011-10-28 ENCOUNTER — Other Ambulatory Visit: Payer: Self-pay | Admitting: Oncology

## 2011-10-29 ENCOUNTER — Ambulatory Visit (HOSPITAL_BASED_OUTPATIENT_CLINIC_OR_DEPARTMENT_OTHER): Payer: Medicare Other

## 2011-10-29 ENCOUNTER — Ambulatory Visit (HOSPITAL_BASED_OUTPATIENT_CLINIC_OR_DEPARTMENT_OTHER): Payer: Medicare Other | Admitting: Oncology

## 2011-10-29 ENCOUNTER — Telehealth: Payer: Self-pay | Admitting: Oncology

## 2011-10-29 ENCOUNTER — Other Ambulatory Visit: Payer: Medicare Other

## 2011-10-29 VITALS — BP 135/86 | HR 78 | Temp 97.0°F | Ht 74.5 in | Wt 262.4 lb

## 2011-10-29 DIAGNOSIS — C20 Malignant neoplasm of rectum: Secondary | ICD-10-CM

## 2011-10-29 DIAGNOSIS — Z85048 Personal history of other malignant neoplasm of rectum, rectosigmoid junction, and anus: Secondary | ICD-10-CM

## 2011-10-29 DIAGNOSIS — Z5111 Encounter for antineoplastic chemotherapy: Secondary | ICD-10-CM

## 2011-10-29 MED ORDER — SODIUM CHLORIDE 0.9 % IJ SOLN
10.0000 mL | INTRAMUSCULAR | Status: DC | PRN
  Filled 2011-10-29: qty 10

## 2011-10-29 MED ORDER — LEUCOVORIN CALCIUM INJECTION 350 MG
400.0000 mg/m2 | Freq: Once | INTRAVENOUS | Status: AC
  Administered 2011-10-29: 1004 mg via INTRAVENOUS
  Filled 2011-10-29: qty 50.2

## 2011-10-29 MED ORDER — ONDANSETRON 8 MG/50ML IVPB (CHCC)
8.0000 mg | Freq: Once | INTRAVENOUS | Status: AC
  Administered 2011-10-29: 8 mg via INTRAVENOUS

## 2011-10-29 MED ORDER — HEPARIN SOD (PORK) LOCK FLUSH 100 UNIT/ML IV SOLN
500.0000 [IU] | Freq: Once | INTRAVENOUS | Status: DC | PRN
  Filled 2011-10-29: qty 5

## 2011-10-29 MED ORDER — DEXAMETHASONE SODIUM PHOSPHATE 10 MG/ML IJ SOLN
10.0000 mg | Freq: Once | INTRAMUSCULAR | Status: AC
  Administered 2011-10-29: 10 mg via INTRAVENOUS

## 2011-10-29 MED ORDER — OXALIPLATIN CHEMO INJECTION 100 MG/20ML
68.0000 mg/m2 | Freq: Once | INTRAVENOUS | Status: AC
  Administered 2011-10-29: 170 mg via INTRAVENOUS
  Filled 2011-10-29: qty 34

## 2011-10-29 MED ORDER — FLUOROURACIL CHEMO INJECTION 2.5 GM/50ML
400.0000 mg/m2 | Freq: Once | INTRAVENOUS | Status: AC
  Administered 2011-10-29: 1000 mg via INTRAVENOUS
  Filled 2011-10-29: qty 20

## 2011-10-29 MED ORDER — DEXTROSE 5 % IV SOLN
Freq: Once | INTRAVENOUS | Status: AC
  Administered 2011-10-29: 12:00:00 via INTRAVENOUS

## 2011-10-29 MED ORDER — SODIUM CHLORIDE 0.9 % IV SOLN
2400.0000 mg/m2 | INTRAVENOUS | Status: DC
  Administered 2011-10-29: 6000 mg via INTRAVENOUS
  Filled 2011-10-29: qty 120

## 2011-10-29 NOTE — Progress Notes (Signed)
OFFICE PROGRESS NOTE   INTERVAL HISTORY:   He completed a fifth cycle of FOLFOX on 10/15/2011. He denies nausea, mouth sores, diarrhea, and neuropathy symptoms. No difficulty with bowel function. No rectal bleeding.  Objective:  Vital signs in last 24 hours:  Blood pressure 135/86, pulse 78, temperature 97 F (36.1 C), temperature source Oral, height 6' 2.5" (1.892 m), weight 262 lb 6.4 oz (119.024 kg).    HEENT: No thrush or ulcers Lymphatics: No cervical, supraclavicular, or axillary nodes Resp: Lungs clear bilaterally Cardio: Regular rate and rhythm GI: Nontender, no hepatomegaly, no mass Vascular: The left lower leg is larger than the right side. No erythema. Neuro: Very mild decrease in vibratory sense at the fingertips   Portacath/PICC-without erythema  Lab Results:  Lab Results  Component Value Date   WBC 3.7* 10/27/2011   HGB 12.0* 10/27/2011   HCT 35.9* 10/27/2011   MCV 93.7 10/27/2011   PLT 112* 10/27/2011   ANC 2.3  X-rays: A CT of the chest on 10/27/2011 was compared to a CT from 08/06/2011. No pathologically enlarged mediastinal, hilar, or axillary nodes. Interval decrease in the size and number of bilateral pulmonary nodules.  Medications: I have reviewed the patient's current medications.  Assessment/Plan: 1.Rectal cancer, clinical uT3 uN0, status post concurrent chemotherapy/radiation with radiation completed March 11, 2010. He is status post laparoscopic-assisted low anterior resection with diverting loop ileostomy 05/30/2010 with final pathology showing no evidence of malignancy. He declined adjuvant chemotherapy.  2. History of sigmoid colon polyp at 20.0 cm status post biopsy confirming high-grade dysplasia.  3. History of mild anemia likely related to chemotherapy/radiation and rectal bleeding.  4. Mental disability.  5. History of bilateral leg edema status post negative Doppler of the left leg December 19, 2009.  6. Family history of multiple cancers.  7.  Colonoscopy 07/14/2011 with finding of a distal rectal mass status post biopsy with pathology showing invasive colorectal adenocarcinoma, moderately differentiated. Biopsy of a polyp in the ascending colon showed a tubular adenoma.  8. CT abdomen and pelvis 06/13/2011 with interval development of multiple pulmonary nodules in both lung bases. Multiple bilateral lung nodules confirmed on a CT of the chest 08/06/2011. Chemotherapy initiated with FOLFOX on 08/20/2011.       -Restaging CT of the chest on 10/27/2011 after 5 cycles of FOLFOX reveals a decrease in the size and number of pulmonary nodules  9. Status post Port-A-Cath placement 08/14/2011.  10. CEA 3.7 on 08/07/2011.   Disposition:  He appears stable. He has completed 5 cycles of FOLFOX chemotherapy. He has tolerated the chemotherapy well to date. A restaging CT on 10/27/2011 confirms a significant decrease in the pulmonary nodules. I reviewed the CT with the patient and his family.  We decided to continue FOLFOX chemotherapy. He will complete cycle 6 today. He will return for an office visit and cycle 7 in 2 weeks. We will plan another restaging CT of the chest after 4-5 additional cycles of FOLFOX.   Thornton Papas, MD  10/29/2011  1:48 PM

## 2011-10-29 NOTE — Telephone Encounter (Signed)
gve the pt's sister the may,june 2013 appt calendar. Sent michelle a staqff message to add the chemo

## 2011-10-30 ENCOUNTER — Telehealth: Payer: Self-pay | Admitting: *Deleted

## 2011-10-30 NOTE — Telephone Encounter (Signed)
Per staff message from [place], I have scheduled the appts for the patient. Appts in computer and Crystal aware. JMW  

## 2011-10-31 ENCOUNTER — Ambulatory Visit (HOSPITAL_BASED_OUTPATIENT_CLINIC_OR_DEPARTMENT_OTHER): Payer: Medicare Other

## 2011-10-31 VITALS — BP 119/76 | HR 67 | Temp 98.4°F

## 2011-10-31 DIAGNOSIS — Z85048 Personal history of other malignant neoplasm of rectum, rectosigmoid junction, and anus: Secondary | ICD-10-CM

## 2011-10-31 DIAGNOSIS — C20 Malignant neoplasm of rectum: Secondary | ICD-10-CM

## 2011-10-31 MED ORDER — HEPARIN SOD (PORK) LOCK FLUSH 100 UNIT/ML IV SOLN
500.0000 [IU] | Freq: Once | INTRAVENOUS | Status: AC | PRN
  Administered 2011-10-31: 500 [IU]
  Filled 2011-10-31: qty 5

## 2011-10-31 MED ORDER — SODIUM CHLORIDE 0.9 % IJ SOLN
10.0000 mL | INTRAMUSCULAR | Status: DC | PRN
  Administered 2011-10-31: 10 mL
  Filled 2011-10-31: qty 10

## 2011-11-11 ENCOUNTER — Other Ambulatory Visit: Payer: Self-pay | Admitting: Oncology

## 2011-11-12 ENCOUNTER — Ambulatory Visit (HOSPITAL_BASED_OUTPATIENT_CLINIC_OR_DEPARTMENT_OTHER): Payer: Medicare Other | Admitting: Nurse Practitioner

## 2011-11-12 ENCOUNTER — Ambulatory Visit (HOSPITAL_BASED_OUTPATIENT_CLINIC_OR_DEPARTMENT_OTHER): Payer: Medicare Other

## 2011-11-12 ENCOUNTER — Telehealth: Payer: Self-pay | Admitting: Oncology

## 2011-11-12 ENCOUNTER — Other Ambulatory Visit (HOSPITAL_BASED_OUTPATIENT_CLINIC_OR_DEPARTMENT_OTHER): Payer: Medicare Other | Admitting: Lab

## 2011-11-12 VITALS — BP 131/84 | HR 70 | Temp 97.3°F | Ht 74.5 in | Wt 264.3 lb

## 2011-11-12 DIAGNOSIS — C20 Malignant neoplasm of rectum: Secondary | ICD-10-CM

## 2011-11-12 DIAGNOSIS — C7800 Secondary malignant neoplasm of unspecified lung: Secondary | ICD-10-CM

## 2011-11-12 DIAGNOSIS — Z5111 Encounter for antineoplastic chemotherapy: Secondary | ICD-10-CM

## 2011-11-12 DIAGNOSIS — Z85048 Personal history of other malignant neoplasm of rectum, rectosigmoid junction, and anus: Secondary | ICD-10-CM

## 2011-11-12 LAB — CBC WITH DIFFERENTIAL/PLATELET
BASO%: 1.5 % (ref 0.0–2.0)
HCT: 37.1 % — ABNORMAL LOW (ref 38.4–49.9)
MCHC: 33.3 g/dL (ref 32.0–36.0)
MONO#: 0.7 10*3/uL (ref 0.1–0.9)
NEUT%: 56.1 % (ref 39.0–75.0)
RBC: 3.95 10*6/uL — ABNORMAL LOW (ref 4.20–5.82)
WBC: 3 10*3/uL — ABNORMAL LOW (ref 4.0–10.3)
lymph#: 0.5 10*3/uL — ABNORMAL LOW (ref 0.9–3.3)
nRBC: 0 % (ref 0–0)

## 2011-11-12 LAB — COMPREHENSIVE METABOLIC PANEL
ALT: 80 U/L — ABNORMAL HIGH (ref 0–53)
AST: 60 U/L — ABNORMAL HIGH (ref 0–37)
Albumin: 3.8 g/dL (ref 3.5–5.2)
Calcium: 9.2 mg/dL (ref 8.4–10.5)
Chloride: 102 mEq/L (ref 96–112)
Potassium: 3.6 mEq/L (ref 3.5–5.3)
Sodium: 139 mEq/L (ref 135–145)
Total Protein: 7 g/dL (ref 6.0–8.3)

## 2011-11-12 MED ORDER — PROCHLORPERAZINE MALEATE 10 MG PO TABS
10.0000 mg | ORAL_TABLET | Freq: Once | ORAL | Status: AC
Start: 2011-11-12 — End: 2011-11-12
  Administered 2011-11-12: 10 mg via ORAL

## 2011-11-12 MED ORDER — SODIUM CHLORIDE 0.9 % IV SOLN
2400.0000 mg/m2 | INTRAVENOUS | Status: DC
  Administered 2011-11-12: 6000 mg via INTRAVENOUS
  Filled 2011-11-12: qty 120

## 2011-11-12 MED ORDER — LEUCOVORIN CALCIUM INJECTION 350 MG
400.0000 mg/m2 | Freq: Once | INTRAVENOUS | Status: AC
  Administered 2011-11-12: 1004 mg via INTRAVENOUS
  Filled 2011-11-12: qty 50.2

## 2011-11-12 MED ORDER — OXALIPLATIN CHEMO INJECTION 100 MG/20ML
68.0000 mg/m2 | Freq: Once | INTRAVENOUS | Status: AC
  Administered 2011-11-12: 170 mg via INTRAVENOUS
  Filled 2011-11-12: qty 34

## 2011-11-12 MED ORDER — DEXTROSE 5 % IV SOLN
Freq: Once | INTRAVENOUS | Status: AC
  Administered 2011-11-12: 14:00:00 via INTRAVENOUS

## 2011-11-12 MED ORDER — DEXAMETHASONE SODIUM PHOSPHATE 10 MG/ML IJ SOLN
10.0000 mg | Freq: Once | INTRAMUSCULAR | Status: AC
  Administered 2011-11-12: 10 mg via INTRAVENOUS

## 2011-11-12 MED ORDER — ONDANSETRON 8 MG/50ML IVPB (CHCC)
8.0000 mg | Freq: Once | INTRAVENOUS | Status: AC
  Administered 2011-11-12: 8 mg via INTRAVENOUS

## 2011-11-12 MED ORDER — FLUOROURACIL CHEMO INJECTION 2.5 GM/50ML
400.0000 mg/m2 | Freq: Once | INTRAVENOUS | Status: AC
  Administered 2011-11-12: 1000 mg via INTRAVENOUS
  Filled 2011-11-12: qty 20

## 2011-11-12 MED ORDER — HEPARIN SOD (PORK) LOCK FLUSH 100 UNIT/ML IV SOLN
500.0000 [IU] | Freq: Once | INTRAVENOUS | Status: DC | PRN
  Filled 2011-11-12: qty 5

## 2011-11-12 MED ORDER — SODIUM CHLORIDE 0.9 % IJ SOLN
10.0000 mL | INTRAMUSCULAR | Status: DC | PRN
  Filled 2011-11-12: qty 10

## 2011-11-12 NOTE — Telephone Encounter (Signed)
Gave pt appt calendar for May and June 2013 lab chemo , [facility], emailed michelle regarding chemo for 12/10/2011

## 2011-11-12 NOTE — Progress Notes (Signed)
Pt c/o vomiting x 2 during infusion, pt states he is no longer nauseated.  Per Lonna Cobb, NP, okay to give 10mg  compazine PO.  Will continue to monitor.  SLJ

## 2011-11-12 NOTE — Progress Notes (Signed)
OFFICE PROGRESS NOTE  Interval history:  [name] returns as scheduled. He completed cycle 6 of FOLFOX on 10/29/2011. He denies nausea/vomiting. No mouth sores. No diarrhea. No numbness or tingling in his hands or feet. He did not experience cold sensitivity. He denies rectal bleeding.   Objective: Blood pressure 131/84, pulse 70, temperature 97.3 F (36.3 C), temperature source Oral, height 6' 2.5" (1.892 m), weight 264 lb 4.8 oz (119.886 kg).  Oropharynx is without thrush or ulceration. Lungs are clear. Regular cardiac rhythm. Port-A-Cath site is without erythema. Abdomen is soft and nontender. No hepatomegaly. Left lower leg is larger than the right lower leg. Calves are nontender. Mild decrease in vibratory sense at the fingertips.  Lab Results: Lab Results  Component Value Date   WBC 3.0* 11/12/2011   HGB 12.3* 11/12/2011   HCT 37.1* 11/12/2011   MCV 93.9 11/12/2011   PLT 91* 11/12/2011    Chemistry:    Chemistry      Component Value Date/Time   NA 139 11/12/2011 1112   NA 141 10/27/2011 0857   K 3.6 11/12/2011 1112   K 4.4 10/27/2011 0857   CL 102 11/12/2011 1112   CL 98 10/27/2011 0857   CO2 28 11/12/2011 1112   CO2 29 10/27/2011 0857   BUN 11 11/12/2011 1112   BUN 17 10/27/2011 0857   CREATININE 1.02 11/12/2011 1112   CREATININE 1.1 10/27/2011 0857      Component Value Date/Time   CALCIUM 9.2 11/12/2011 1112   CALCIUM 9.0 10/27/2011 0857   ALKPHOS 50 11/12/2011 1112   ALKPHOS 41 10/27/2011 0857   AST 60* 11/12/2011 1112   AST 31 10/27/2011 0857   ALT 80* 11/12/2011 1112   BILITOT 0.4 11/12/2011 1112   BILITOT 0.70 10/27/2011 0857       Studies/Results: Ct Chest W Contrast  10/27/2011  *RADIOLOGY REPORT*  Clinical Data: Rectal carcinoma.  Lung nodules on CT abdomen.  CT CHEST WITH CONTRAST  Technique:  Multidetector CT imaging of the chest was performed following the standard protocol during bolus administration of intravenous contrast.  Contrast: 80mL OMNIPAQUE IOHEXOL 300 MG/ML  SOLN   Comparison: CT chest 08/06/2011 and CT abdomen pelvis 06/13/2011.  Findings: No pathologically enlarged mediastinal, hilar or axillary lymph nodes.  Air in the main pulmonary artery is presumably iatrogenic etiology.  Heart size normal.  No pericardial effusion.  There has been an interval decrease in size and number of bilateral pulmonary nodules.  Index right lower lobe nodule measures 9 x 7 mm on image 36 (previously 12 x 8 mm).  No pleural fluid.  Airway is unremarkable.  Incidental imaging of the upper abdomen shows no acute findings. No worrisome lytic or sclerotic lesions.  IMPRESSION: Interval regression of pulmonary metastatic disease, without complete resolution.  Original Report Authenticated By: Reyes Ivan, M.D.    Medications: I have reviewed the patient's current medications.  Assessment/Plan:  1.Rectal cancer, clinical uT3 uN0, status post concurrent chemotherapy/radiation with radiation completed March 11, 2010. He is status post laparoscopic-assisted low anterior resection with diverting loop ileostomy 05/30/2010 with final pathology showing no evidence of malignancy. He declined adjuvant chemotherapy.  2. History of sigmoid colon polyp at 20.0 cm status post biopsy confirming high-grade dysplasia.  3. History of mild anemia likely related to chemotherapy/radiation and rectal bleeding.  4. Mental disability.  5. History of bilateral leg edema status post negative Doppler of the left leg December 19, 2009.  6. Family history of multiple cancers.  7. Colonoscopy 07/14/2011 with finding of a distal rectal mass status post biopsy with pathology showing invasive colorectal adenocarcinoma, moderately differentiated. Biopsy of a polyp in the ascending colon showed a tubular adenoma.  8. CT abdomen and pelvis 06/13/2011 with interval development of multiple pulmonary nodules in both lung bases. Multiple bilateral lung nodules confirmed on a CT of the chest 08/06/2011. Chemotherapy  initiated with FOLFOX on 08/20/2011. Restaging CT of the chest on 10/27/2011 after 5 cycles of FOLFOX showed a decrease in the size and number of pulmonary nodules. He completed cycle 6 FOLFOX on 10/29/2011.  9. Status post Port-A-Cath placement 08/14/2011.  10. CEA 3.7 on 08/07/2011.   Disposition-[name] appears stable. He has completed 6 cycles of FOLFOX chemotherapy. Plan to proceed with cycle 7 today as scheduled. He will return for a followup visit and cycle 8 in 2 weeks. He will contact the office in the interim with any problems.  Plan reviewed with Dr. Truett Perna.   Lonna Cobb ANP/GNP-BC

## 2011-11-13 ENCOUNTER — Inpatient Hospital Stay: Payer: Medicare Other

## 2011-11-14 ENCOUNTER — Ambulatory Visit (HOSPITAL_BASED_OUTPATIENT_CLINIC_OR_DEPARTMENT_OTHER): Payer: Medicare Other

## 2011-11-14 VITALS — BP 129/79 | HR 69 | Temp 98.6°F

## 2011-11-14 DIAGNOSIS — Z85048 Personal history of other malignant neoplasm of rectum, rectosigmoid junction, and anus: Secondary | ICD-10-CM

## 2011-11-14 DIAGNOSIS — Z452 Encounter for adjustment and management of vascular access device: Secondary | ICD-10-CM

## 2011-11-14 MED ORDER — HEPARIN SOD (PORK) LOCK FLUSH 100 UNIT/ML IV SOLN
500.0000 [IU] | Freq: Once | INTRAVENOUS | Status: AC | PRN
  Administered 2011-11-14: 500 [IU]
  Filled 2011-11-14: qty 5

## 2011-11-14 MED ORDER — SODIUM CHLORIDE 0.9 % IJ SOLN
10.0000 mL | INTRAMUSCULAR | Status: DC | PRN
  Administered 2011-11-14: 10 mL
  Filled 2011-11-14: qty 10

## 2011-11-14 NOTE — Patient Instructions (Signed)
CALL MD FOR PROBLEMS 

## 2011-11-21 ENCOUNTER — Telehealth: Payer: Self-pay | Admitting: Oncology

## 2011-11-21 NOTE — Telephone Encounter (Signed)
Called pt, left message regarding lab, MD and chemo on June 2014

## 2011-11-24 ENCOUNTER — Other Ambulatory Visit: Payer: Self-pay | Admitting: Oncology

## 2011-11-24 ENCOUNTER — Telehealth: Payer: Self-pay | Admitting: *Deleted

## 2011-11-24 NOTE — Telephone Encounter (Signed)
Patient has moved his other MD vivist on 6/19 to another day. I have scheduled treatment appt.  JMW

## 2011-11-26 ENCOUNTER — Inpatient Hospital Stay: Payer: Medicare Other

## 2011-11-26 ENCOUNTER — Telehealth: Payer: Self-pay | Admitting: Oncology

## 2011-11-26 ENCOUNTER — Other Ambulatory Visit (HOSPITAL_BASED_OUTPATIENT_CLINIC_OR_DEPARTMENT_OTHER): Payer: Medicare Other | Admitting: Lab

## 2011-11-26 ENCOUNTER — Ambulatory Visit (HOSPITAL_BASED_OUTPATIENT_CLINIC_OR_DEPARTMENT_OTHER): Payer: Medicare Other | Admitting: Oncology

## 2011-11-26 VITALS — BP 122/79 | HR 74 | Temp 97.0°F | Ht 74.5 in | Wt 268.2 lb

## 2011-11-26 DIAGNOSIS — C20 Malignant neoplasm of rectum: Secondary | ICD-10-CM

## 2011-11-26 DIAGNOSIS — D702 Other drug-induced agranulocytosis: Secondary | ICD-10-CM

## 2011-11-26 DIAGNOSIS — G620 Drug-induced polyneuropathy: Secondary | ICD-10-CM

## 2011-11-26 DIAGNOSIS — D6959 Other secondary thrombocytopenia: Secondary | ICD-10-CM

## 2011-11-26 LAB — CBC WITH DIFFERENTIAL/PLATELET
Basophils Absolute: 0 10*3/uL (ref 0.0–0.1)
Eosinophils Absolute: 0.1 10*3/uL (ref 0.0–0.5)
HCT: 36.3 % — ABNORMAL LOW (ref 38.4–49.9)
HGB: 12.3 g/dL — ABNORMAL LOW (ref 13.0–17.1)
LYMPH%: 33.1 % (ref 14.0–49.0)
MCHC: 33.9 g/dL (ref 32.0–36.0)
MONO#: 0.7 10*3/uL (ref 0.1–0.9)
NEUT#: 1.1 10*3/uL — ABNORMAL LOW (ref 1.5–6.5)
NEUT%: 38.6 % — ABNORMAL LOW (ref 39.0–75.0)
Platelets: 85 10*3/uL — ABNORMAL LOW (ref 140–400)
WBC: 2.7 10*3/uL — ABNORMAL LOW (ref 4.0–10.3)
lymph#: 0.9 10*3/uL (ref 0.9–3.3)

## 2011-11-26 NOTE — Progress Notes (Signed)
   [facility]    OFFICE PROGRESS NOTE   INTERVAL HISTORY:   He completed another cycle of FOLFOX on may 22nd 2013. He denies mouth sores and diarrhea. No neuropathy symptoms. He had 2 episodes of vomiting during the week following chemotherapy. No other complaint.  Objective:  Vital signs in last 24 hours:  Blood pressure 122/79, pulse 74, temperature 97 F (36.1 C), temperature source Oral, height 6' 2.5" (1.892 m), weight 268 lb 3.2 oz (121.655 kg).    HEENT: No thrush or ulcers Resp: Lungs clear bilaterally Cardio: Regular rate and rhythm GI: Nontender, no hepatomegaly Vascular: The left lower leg is larger than the right side, no erythema Neuro: Mild to moderate decrease in vibratory sense at the fingertip bilaterally    Portacath/PICC-without erythema  Lab Results:  Lab Results  Component Value Date   WBC 2.7* 11/26/2011   HGB 12.3* 11/26/2011   HCT 36.3* 11/26/2011   MCV 91.0 11/26/2011   PLT 85* 11/26/2011   ANC 1.1    Medications: I have reviewed the patient's current medications.  Assessment/Plan: 1.Rectal cancer, clinical uT3 uN0, status post concurrent chemotherapy/radiation with radiation completed March 11, 2010. He is status post laparoscopic-assisted low anterior resection with diverting loop ileostomy 05/30/2010 with final pathology showing no evidence of malignancy. He declined adjuvant chemotherapy.  2. History of sigmoid colon polyp at 20.0 cm status post biopsy confirming high-grade dysplasia.  3. History of mild anemia likely related to chemotherapy/radiation and rectal bleeding.  4. Mental disability.  5. History of bilateral leg edema status post negative Doppler of the left leg December 19, 2009.  6. Family history of multiple cancers.  7. Colonoscopy 07/14/2011 with finding of a distal rectal mass status post biopsy with pathology showing invasive colorectal adenocarcinoma, moderately differentiated. Biopsy of a polyp in the ascending  colon showed a tubular adenoma.  8. CT abdomen and pelvis 06/13/2011 with interval development of multiple pulmonary nodules in both lung bases. Multiple bilateral lung nodules confirmed on a CT of the chest 08/06/2011. Chemotherapy initiated with FOLFOX on 08/20/2011. Restaging CT of the chest on 10/27/2011 after 5 cycles of FOLFOX showed a decrease in the size and number of pulmonary nodules. He completed cycle 7 FOLFOX on 11/12/2011.  9. Status post Port-A-Cath placement 08/14/2011.  10. 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 Neutropenia/thrombocytopenia secondary to chemotherapy    Disposition:  He appears stable. Chemotherapy will be held today due to 2 neutropenia/thrombocytopenia. He will return for another cycle of FOLFOX in 1 week. He is scheduled for an office visit and cycle 9 of FOLFOX on 12/17/2011. We will dose reduce the oxaliplatin beginning with cycle 8.   Thornton Papas, MD  11/26/2011  8:56 PM

## 2011-11-26 NOTE — Telephone Encounter (Signed)
l/m with 6/12 and 6/26 appt info   aom

## 2011-12-03 ENCOUNTER — Ambulatory Visit: Payer: Medicare Other

## 2011-12-03 ENCOUNTER — Other Ambulatory Visit (HOSPITAL_BASED_OUTPATIENT_CLINIC_OR_DEPARTMENT_OTHER): Payer: Medicare Other | Admitting: Lab

## 2011-12-03 DIAGNOSIS — Z85048 Personal history of other malignant neoplasm of rectum, rectosigmoid junction, and anus: Secondary | ICD-10-CM

## 2011-12-03 DIAGNOSIS — C20 Malignant neoplasm of rectum: Secondary | ICD-10-CM

## 2011-12-03 LAB — CBC WITH DIFFERENTIAL/PLATELET
BASO%: 0.9 % (ref 0.0–2.0)
EOS%: 2.3 % (ref 0.0–7.0)
MCH: 30.6 pg (ref 27.2–33.4)
MCHC: 33.6 g/dL (ref 32.0–36.0)
RBC: 3.85 10*6/uL — ABNORMAL LOW (ref 4.20–5.82)
RDW: 16.3 % — ABNORMAL HIGH (ref 11.0–14.6)
lymph#: 0.6 10*3/uL — ABNORMAL LOW (ref 0.9–3.3)

## 2011-12-03 LAB — COMPREHENSIVE METABOLIC PANEL
AST: 50 U/L — ABNORMAL HIGH (ref 0–37)
Albumin: 3.5 g/dL (ref 3.5–5.2)
Alkaline Phosphatase: 52 U/L (ref 39–117)
Potassium: 3.7 mEq/L (ref 3.5–5.3)
Sodium: 140 mEq/L (ref 135–145)
Total Protein: 6.7 g/dL (ref 6.0–8.3)

## 2011-12-03 MED ORDER — OXALIPLATIN CHEMO INJECTION 100 MG/20ML: 60.0000 mg/m2 | Freq: Once | INTRAVENOUS | Status: DC

## 2011-12-03 MED ORDER — LEUCOVORIN CALCIUM INJECTION 350 MG: 400.0000 mg/m2 | Freq: Once | INTRAVENOUS | Status: DC

## 2011-12-03 MED ORDER — DEXTROSE 5 % IV SOLN: Freq: Once | INTRAVENOUS | Status: DC

## 2011-12-03 MED ORDER — DEXAMETHASONE SODIUM PHOSPHATE 10 MG/ML IJ SOLN: 10.0000 mg | Freq: Once | INTRAMUSCULAR | Status: DC

## 2011-12-03 MED ORDER — PALONOSETRON HCL INJECTION 0.25 MG/5ML: 0.2500 mg | Freq: Once | INTRAVENOUS | Status: DC

## 2011-12-03 MED ORDER — SODIUM CHLORIDE 0.9 % IV SOLN: 2400.0000 mg/m2 | INTRAVENOUS | Status: DC

## 2011-12-03 MED ORDER — SODIUM CHLORIDE 0.9 % IJ SOLN
10.0000 mL | INTRAMUSCULAR | Status: DC | PRN
  Filled 2011-12-03: qty 10

## 2011-12-03 MED ORDER — HEPARIN SOD (PORK) LOCK FLUSH 100 UNIT/ML IV SOLN
500.0000 [IU] | Freq: Once | INTRAVENOUS | Status: DC | PRN
  Filled 2011-12-03: qty 5

## 2011-12-03 MED ORDER — FLUOROURACIL CHEMO INJECTION 2.5 GM/50ML: 400.0000 mg/m2 | Freq: Once | INTRAVENOUS | Status: DC

## 2011-12-03 NOTE — Progress Notes (Signed)
No treatment 12/03/2011 due to lab counts per Lonna Cobb, NP and Dr .Truett Perna.

## 2011-12-10 ENCOUNTER — Inpatient Hospital Stay: Payer: Medicare Other

## 2011-12-10 ENCOUNTER — Other Ambulatory Visit: Payer: Medicare Other

## 2011-12-10 ENCOUNTER — Ambulatory Visit (INDEPENDENT_AMBULATORY_CARE_PROVIDER_SITE_OTHER): Payer: Medicare Other | Admitting: General Surgery

## 2011-12-10 ENCOUNTER — Ambulatory Visit: Payer: Medicare Other | Admitting: Nurse Practitioner

## 2011-12-16 ENCOUNTER — Other Ambulatory Visit: Payer: Self-pay | Admitting: Oncology

## 2011-12-17 ENCOUNTER — Telehealth: Payer: Self-pay | Admitting: *Deleted

## 2011-12-17 ENCOUNTER — Other Ambulatory Visit (HOSPITAL_BASED_OUTPATIENT_CLINIC_OR_DEPARTMENT_OTHER): Payer: Medicare Other | Admitting: Lab

## 2011-12-17 ENCOUNTER — Ambulatory Visit (HOSPITAL_BASED_OUTPATIENT_CLINIC_OR_DEPARTMENT_OTHER): Payer: Medicare Other | Admitting: Nurse Practitioner

## 2011-12-17 ENCOUNTER — Ambulatory Visit (HOSPITAL_BASED_OUTPATIENT_CLINIC_OR_DEPARTMENT_OTHER): Payer: Medicare Other

## 2011-12-17 VITALS — BP 136/82 | HR 88 | Temp 96.9°F | Ht 74.5 in | Wt 272.9 lb

## 2011-12-17 DIAGNOSIS — Z5111 Encounter for antineoplastic chemotherapy: Secondary | ICD-10-CM

## 2011-12-17 DIAGNOSIS — C20 Malignant neoplasm of rectum: Secondary | ICD-10-CM

## 2011-12-17 DIAGNOSIS — C7800 Secondary malignant neoplasm of unspecified lung: Secondary | ICD-10-CM

## 2011-12-17 DIAGNOSIS — D709 Neutropenia, unspecified: Secondary | ICD-10-CM

## 2011-12-17 DIAGNOSIS — D696 Thrombocytopenia, unspecified: Secondary | ICD-10-CM

## 2011-12-17 LAB — COMPREHENSIVE METABOLIC PANEL
Albumin: 3.9 g/dL (ref 3.5–5.2)
BUN: 17 mg/dL (ref 6–23)
CO2: 26 mEq/L (ref 19–32)
Calcium: 8.8 mg/dL (ref 8.4–10.5)
Chloride: 102 mEq/L (ref 96–112)
Creatinine, Ser: 1.01 mg/dL (ref 0.50–1.35)
Glucose, Bld: 87 mg/dL (ref 70–99)
Potassium: 3.9 mEq/L (ref 3.5–5.3)

## 2011-12-17 LAB — CBC WITH DIFFERENTIAL/PLATELET
Basophils Absolute: 0 10*3/uL (ref 0.0–0.1)
EOS%: 1.4 % (ref 0.0–7.0)
Eosinophils Absolute: 0.1 10*3/uL (ref 0.0–0.5)
HCT: 36.1 % — ABNORMAL LOW (ref 38.4–49.9)
HGB: 12.2 g/dL — ABNORMAL LOW (ref 13.0–17.1)
MCH: 31.1 pg (ref 27.2–33.4)
MONO#: 0.4 10*3/uL (ref 0.1–0.9)
NEUT#: 3.1 10*3/uL (ref 1.5–6.5)
NEUT%: 73.4 % (ref 39.0–75.0)
lymph#: 0.7 10*3/uL — ABNORMAL LOW (ref 0.9–3.3)

## 2011-12-17 MED ORDER — OXALIPLATIN CHEMO INJECTION 100 MG/20ML
60.0000 mg/m2 | Freq: Once | INTRAVENOUS | Status: AC
  Administered 2011-12-17: 150 mg via INTRAVENOUS
  Filled 2011-12-17: qty 30

## 2011-12-17 MED ORDER — FLUOROURACIL CHEMO INJECTION 2.5 GM/50ML
400.0000 mg/m2 | Freq: Once | INTRAVENOUS | Status: AC
  Administered 2011-12-17: 1000 mg via INTRAVENOUS
  Filled 2011-12-17: qty 20

## 2011-12-17 MED ORDER — DEXTROSE 5 % IV SOLN: Freq: Once | INTRAVENOUS | Status: DC

## 2011-12-17 MED ORDER — PALONOSETRON HCL INJECTION 0.25 MG/5ML: 0.2500 mg | Freq: Once | INTRAVENOUS | Status: DC

## 2011-12-17 MED ORDER — DEXAMETHASONE SODIUM PHOSPHATE 10 MG/ML IJ SOLN: 10.0000 mg | Freq: Once | INTRAMUSCULAR | Status: DC

## 2011-12-17 MED ORDER — LEUCOVORIN CALCIUM INJECTION 350 MG
400.0000 mg/m2 | Freq: Once | INTRAVENOUS | Status: AC
  Administered 2011-12-17: 1004 mg via INTRAVENOUS
  Filled 2011-12-17: qty 50.2

## 2011-12-17 MED ORDER — SODIUM CHLORIDE 0.9 % IV SOLN
2400.0000 mg/m2 | INTRAVENOUS | Status: DC
  Administered 2011-12-17: 6000 mg via INTRAVENOUS
  Filled 2011-12-17: qty 120

## 2011-12-17 NOTE — Telephone Encounter (Signed)
Per staff message I have scheduled appts. JMW  

## 2011-12-17 NOTE — Progress Notes (Signed)
OFFICE PROGRESS NOTE  Interval history:  [name] returns as scheduled. He was last treated with FOLFOX chemotherapy on 11/12/2011. Chemotherapy was held on 11/26/2011 due to neutropenia and thrombocytopenia and on 12/03/2011 due to neutropenia.   [name] reports that he feels well. He denies nausea/vomiting. No mouth sores. No diarrhea. He denies numbness or tingling in his hands or feet. He has a good appetite.   Objective: Blood pressure 136/82, pulse 88, temperature 96.9 F (36.1 C), temperature source Oral, height 6' 2.5" (1.892 m), weight 272 lb 14.4 oz (123.787 kg).  Oropharynx is without thrush or ulceration. Lungs are clear. Regular cardiac rhythm. Port-A-Cath site is without erythema. Abdomen is soft and nontender. No hepatomegaly. Left lower leg is larger than the right side. Chronic venous stasis changes at the lower legs bilaterally. Vibratory sense is mildly to moderately decreased over the fingertips per tuning fork exam.  Lab Results: Lab Results  Component Value Date   WBC 4.3 12/17/2011   HGB 12.2* 12/17/2011   HCT 36.1* 12/17/2011   MCV 92.1 12/17/2011   PLT 133* 12/17/2011    Chemistry:    Chemistry      Component Value Date/Time   NA 140 12/03/2011 1133   NA 141 10/27/2011 0857   K 3.7 12/03/2011 1133   K 4.4 10/27/2011 0857   CL 105 12/03/2011 1133   CL 98 10/27/2011 0857   CO2 27 12/03/2011 1133   CO2 29 10/27/2011 0857   BUN 14 12/03/2011 1133   BUN 17 10/27/2011 0857   CREATININE 0.95 12/03/2011 1133   CREATININE 1.1 10/27/2011 0857      Component Value Date/Time   CALCIUM 8.8 12/03/2011 1133   CALCIUM 9.0 10/27/2011 0857   ALKPHOS 52 12/03/2011 1133   ALKPHOS 41 10/27/2011 0857   AST 50* 12/03/2011 1133   AST 31 10/27/2011 0857   ALT 63* 12/03/2011 1133   BILITOT 0.3 12/03/2011 1133   BILITOT 0.70 10/27/2011 0857       Studies/Results: No results found.  Medications: I have reviewed the patient's current medications.  Assessment/Plan:  1.Rectal cancer, clinical  uT3 uN0, status post concurrent chemotherapy/radiation with radiation completed March 11, 2010. He is status post laparoscopic-assisted low anterior resection with diverting loop ileostomy 05/30/2010 with final pathology showing no evidence of malignancy. He declined adjuvant chemotherapy.  2. History of sigmoid colon polyp at 20.0 cm status post biopsy confirming high-grade dysplasia.  3. History of mild anemia likely related to chemotherapy/radiation and rectal bleeding.  4. Mental disability.  5. History of bilateral leg edema status post negative Doppler of the left leg December 19, 2009.  6. Family history of multiple cancers.  7. Colonoscopy 07/14/2011 with finding of a distal rectal mass status post biopsy with pathology showing invasive colorectal adenocarcinoma, moderately differentiated. Biopsy of a polyp in the ascending colon showed a tubular adenoma.  8. CT abdomen and pelvis 06/13/2011 with interval development of multiple pulmonary nodules in both lung bases. Multiple bilateral lung nodules confirmed on a CT of the chest 08/06/2011. Chemotherapy initiated with FOLFOX on 08/20/2011. Restaging CT of the chest on 10/27/2011 after 5 cycles of FOLFOX showed a decrease in the size and number of pulmonary nodules. He completed cycle 7 FOLFOX on 11/12/2011. 9. Status post Port-A-Cath placement 08/14/2011.  10. CEA 3.7 on 08/07/2011.  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  12. Neutropenia/thrombocytopenia 11/26/2011 secondary to chemotherapy; persistent neutropenia on 12/03/2011.  Disposition-[name] appears stable. He completed cycle 7 FOLFOX on 11/12/2011. Chemotherapy  was held on 11/26/2011 and 12/03/2011 due to blood counts. Counts have adequately recovered. Plan to proceed with cycle 8 FOLFOX today as scheduled. The oxaliplatin will be dose reduced beginning with today's treatment. [name] will return for a followup visit and cycle 9 FOLFOX in 2 weeks. He will contact the office in the  interim with any problems.  Plan reviewed Dr. Truett Perna.   Lonna Cobb ANP/GNP-BC

## 2011-12-19 ENCOUNTER — Ambulatory Visit (HOSPITAL_BASED_OUTPATIENT_CLINIC_OR_DEPARTMENT_OTHER): Payer: Medicare Other

## 2011-12-19 VITALS — BP 112/69 | HR 69 | Temp 98.2°F

## 2011-12-19 DIAGNOSIS — C20 Malignant neoplasm of rectum: Secondary | ICD-10-CM

## 2011-12-19 DIAGNOSIS — Z452 Encounter for adjustment and management of vascular access device: Secondary | ICD-10-CM

## 2011-12-19 DIAGNOSIS — Z85048 Personal history of other malignant neoplasm of rectum, rectosigmoid junction, and anus: Secondary | ICD-10-CM

## 2011-12-19 MED ORDER — HEPARIN SOD (PORK) LOCK FLUSH 100 UNIT/ML IV SOLN
500.0000 [IU] | Freq: Once | INTRAVENOUS | Status: AC | PRN
  Administered 2011-12-19: 500 [IU]
  Filled 2011-12-19: qty 5

## 2011-12-19 MED ORDER — SODIUM CHLORIDE 0.9 % IJ SOLN
10.0000 mL | INTRAMUSCULAR | Status: DC | PRN
  Administered 2011-12-19: 10 mL
  Filled 2011-12-19: qty 10

## 2011-12-19 NOTE — Patient Instructions (Signed)
Call MD for problems 

## 2011-12-30 ENCOUNTER — Other Ambulatory Visit: Payer: Self-pay | Admitting: Oncology

## 2011-12-31 ENCOUNTER — Other Ambulatory Visit: Payer: Medicare Other | Admitting: Lab

## 2011-12-31 ENCOUNTER — Ambulatory Visit (HOSPITAL_BASED_OUTPATIENT_CLINIC_OR_DEPARTMENT_OTHER): Payer: Medicare Other | Admitting: Nurse Practitioner

## 2011-12-31 ENCOUNTER — Ambulatory Visit: Payer: Medicare Other | Admitting: Nurse Practitioner

## 2011-12-31 ENCOUNTER — Other Ambulatory Visit (HOSPITAL_BASED_OUTPATIENT_CLINIC_OR_DEPARTMENT_OTHER): Payer: Medicare Other | Admitting: Lab

## 2011-12-31 ENCOUNTER — Ambulatory Visit (HOSPITAL_BASED_OUTPATIENT_CLINIC_OR_DEPARTMENT_OTHER): Payer: Medicare Other

## 2011-12-31 VITALS — BP 131/81 | HR 67 | Temp 98.0°F | Ht 74.5 in | Wt 276.8 lb

## 2011-12-31 DIAGNOSIS — C20 Malignant neoplasm of rectum: Secondary | ICD-10-CM

## 2011-12-31 DIAGNOSIS — Z5111 Encounter for antineoplastic chemotherapy: Secondary | ICD-10-CM

## 2011-12-31 DIAGNOSIS — Z85048 Personal history of other malignant neoplasm of rectum, rectosigmoid junction, and anus: Secondary | ICD-10-CM

## 2011-12-31 LAB — COMPREHENSIVE METABOLIC PANEL
Albumin: 3.7 g/dL (ref 3.5–5.2)
CO2: 26 mEq/L (ref 19–32)
Glucose, Bld: 92 mg/dL (ref 70–99)
Potassium: 3.6 mEq/L (ref 3.5–5.3)
Sodium: 137 mEq/L (ref 135–145)
Total Bilirubin: 0.4 mg/dL (ref 0.3–1.2)
Total Protein: 6.8 g/dL (ref 6.0–8.3)

## 2011-12-31 LAB — CBC WITH DIFFERENTIAL/PLATELET
Basophils Absolute: 0 10*3/uL (ref 0.0–0.1)
Eosinophils Absolute: 0.1 10*3/uL (ref 0.0–0.5)
HCT: 33.9 % — ABNORMAL LOW (ref 38.4–49.9)
HGB: 11.5 g/dL — ABNORMAL LOW (ref 13.0–17.1)
MCV: 91.6 fL (ref 79.3–98.0)
MONO%: 11.5 % (ref 0.0–14.0)
NEUT#: 2.4 10*3/uL (ref 1.5–6.5)
RDW: 14.7 % — ABNORMAL HIGH (ref 11.0–14.6)

## 2011-12-31 MED ORDER — DEXTROSE 5 % IV SOLN
Freq: Once | INTRAVENOUS | Status: AC
  Administered 2011-12-31: 13:00:00 via INTRAVENOUS

## 2011-12-31 MED ORDER — LEUCOVORIN CALCIUM INJECTION 350 MG
400.0000 mg/m2 | Freq: Once | INTRAVENOUS | Status: AC
  Administered 2011-12-31: 1004 mg via INTRAVENOUS
  Filled 2011-12-31: qty 50.2

## 2011-12-31 MED ORDER — SODIUM CHLORIDE 0.9 % IV SOLN
2400.0000 mg/m2 | INTRAVENOUS | Status: DC
  Administered 2011-12-31: 6000 mg via INTRAVENOUS
  Filled 2011-12-31: qty 120

## 2011-12-31 MED ORDER — HEPARIN SOD (PORK) LOCK FLUSH 100 UNIT/ML IV SOLN
500.0000 [IU] | Freq: Once | INTRAVENOUS | Status: DC | PRN
  Filled 2011-12-31: qty 5

## 2011-12-31 MED ORDER — FLUOROURACIL CHEMO INJECTION 2.5 GM/50ML
400.0000 mg/m2 | Freq: Once | INTRAVENOUS | Status: AC
  Administered 2011-12-31: 1000 mg via INTRAVENOUS
  Filled 2011-12-31: qty 20

## 2011-12-31 MED ORDER — DEXAMETHASONE SODIUM PHOSPHATE 10 MG/ML IJ SOLN
10.0000 mg | Freq: Once | INTRAMUSCULAR | Status: AC
  Administered 2011-12-31: 10 mg via INTRAVENOUS

## 2011-12-31 MED ORDER — SODIUM CHLORIDE 0.9 % IJ SOLN
10.0000 mL | INTRAMUSCULAR | Status: DC | PRN
  Filled 2011-12-31: qty 10

## 2011-12-31 MED ORDER — PALONOSETRON HCL INJECTION 0.25 MG/5ML
0.2500 mg | Freq: Once | INTRAVENOUS | Status: AC
  Administered 2011-12-31: 0.25 mg via INTRAVENOUS

## 2011-12-31 MED ORDER — OXALIPLATIN CHEMO INJECTION 100 MG/20ML
60.0000 mg/m2 | Freq: Once | INTRAVENOUS | Status: AC
  Administered 2011-12-31: 150 mg via INTRAVENOUS
  Filled 2011-12-31: qty 30

## 2011-12-31 NOTE — Patient Instructions (Addendum)
[facility] Discharge Instructions for Patients Receiving Chemotherapy  Today you received the following chemotherapy agents folfox  To help prevent nausea and vomiting after your treatment, we encourage you to take your nausea medication  and take it as often as prescribed for the next   If you develop nausea and vomiting that is not controlled by your nausea medication, call the clinic. If it is after clinic hours your family physician or the after hours number for the clinic or go to the Emergency Department.   BELOW ARE SYMPTOMS THAT SHOULD BE REPORTED IMMEDIATELY:  *FEVER GREATER THAN 100.5 F  *CHILLS WITH OR WITHOUT FEVER  NAUSEA AND VOMITING THAT IS NOT CONTROLLED WITH YOUR NAUSEA MEDICATION  *UNUSUAL SHORTNESS OF BREATH  *UNUSUAL BRUISING OR BLEEDING  TENDERNESS IN MOUTH AND THROAT WITH OR WITHOUT PRESENCE OF ULCERS  *URINARY PROBLEMS  *BOWEL PROBLEMS  UNUSUAL RASH Items with * indicate a potential emergency and should be followed up as soon as possible.  One of the nurses will contact you 24 hours after your treatment. Please let the nurse know about any problems that you may have experienced. Feel free to call the clinic you have any questions or concerns. The clinic phone number is [phone].   I have been informed and understand all the instructions given to me. I know to contact the clinic, my physician, or go to the Emergency Department if any problems should occur. I do not have any questions at this time, but understand that I may call the clinic during office hours   should I have any questions or need assistance in obtaining follow up care.    __________________________________________  _____________  __________ Signature of Patient or Authorized Representative            Date                   Time    __________________________________________ Nurse's Signature

## 2011-12-31 NOTE — Progress Notes (Signed)
OFFICE PROGRESS NOTE  Interval history:  [name] returns as scheduled. He completed cycle cycle 8 FOLFOX 12/17/2011. He denies nausea/vomiting. No mouth sores. No diarrhea. He did not experience cold sensitivity. No persistent neuropathy symptoms. He has a good appetite. He is gaining weight.   Objective: Blood pressure 131/81, pulse 67, temperature 98 F (36.7 C), temperature source Oral, height 6' 2.5" (1.892 m), weight 276 lb 12.8 oz (125.556 kg).  Oropharynx is without thrush or ulceration. Lungs are clear. Regular cardiac rhythm. Port-A-Cath site is without erythema. Abdomen is soft and nontender. No hepatomegaly. Trace lower leg edema bilaterally. Chronic venous stasis changes at the lower legs bilaterally. Vibratory sense is moderately decreased over the fingertips on the left hand and mildly decreased over the fingertips on the right hand per tuning fork exam.  Lab Results: Lab Results  Component Value Date   WBC 3.6* 12/31/2011   HGB 11.5* 12/31/2011   HCT 33.9* 12/31/2011   MCV 91.6 12/31/2011   PLT 150 12/31/2011    Chemistry:    Chemistry      Component Value Date/Time   NA 136 12/17/2011 0913   NA 141 10/27/2011 0857   K 3.9 12/17/2011 0913   K 4.4 10/27/2011 0857   CL 102 12/17/2011 0913   CL 98 10/27/2011 0857   CO2 26 12/17/2011 0913   CO2 29 10/27/2011 0857   BUN 17 12/17/2011 0913   BUN 17 10/27/2011 0857   CREATININE 1.01 12/17/2011 0913   CREATININE 1.1 10/27/2011 0857      Component Value Date/Time   CALCIUM 8.8 12/17/2011 0913   CALCIUM 9.0 10/27/2011 0857   ALKPHOS 49 12/17/2011 0913   ALKPHOS 41 10/27/2011 0857   AST 26 12/17/2011 0913   AST 31 10/27/2011 0857   ALT 28 12/17/2011 0913   BILITOT 0.3 12/17/2011 0913   BILITOT 0.70 10/27/2011 0857       Studies/Results: No results found.  Medications: I have reviewed the patient's current medications.  Assessment/Plan:  1.Rectal cancer, clinical uT3 uN0, status post concurrent chemotherapy/radiation with radiation  completed March 11, 2010. He is status post laparoscopic-assisted low anterior resection with diverting loop ileostomy 05/30/2010 with final pathology showing no evidence of malignancy. He declined adjuvant chemotherapy.  2. History of sigmoid colon polyp at 20.0 cm status post biopsy confirming high-grade dysplasia.  3. History of mild anemia likely related to chemotherapy/radiation and rectal bleeding.  4. Mental disability.  5. History of bilateral leg edema status post negative Doppler of the left leg December 19, 2009.  6. Family history of multiple cancers.  7. Colonoscopy 07/14/2011 with finding of a distal rectal mass status post biopsy with pathology showing invasive colorectal adenocarcinoma, moderately differentiated. Biopsy of a polyp in the ascending colon showed a tubular adenoma.  8. CT abdomen and pelvis 06/13/2011 with interval development of multiple pulmonary nodules in both lung bases. Multiple bilateral lung nodules confirmed on a CT of the chest 08/06/2011. Chemotherapy initiated with FOLFOX on 08/20/2011. Restaging CT of the chest on 10/27/2011 after 5 cycles of FOLFOX showed a decrease in the size and number of pulmonary nodules. He completed cycle 8 FOLFOX on 12/17/2011.  9. Status post Port-A-Cath placement 08/14/2011.  10. CEA 3.7 on 08/07/2011.  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  12. Neutropenia/thrombocytopenia 11/26/2011 secondary to chemotherapy; persistent neutropenia on 12/03/2011. Oxaliplatin was dose reduced beginning with cycle 8.  Disposition-[name] appears stable. He has completed 8 cycles of FOLFOX chemotherapy. Plan to proceed with cycle 9  today as scheduled. He will return for a followup visit and cycle 10 in 2 weeks. Next restaging CT is planned after cycle 10. He will contact the office prior to his next visit with any problems.  Plan reviewed with Dr. Truett Perna.   Lonna Cobb ANP/GNP-BC

## 2012-01-01 ENCOUNTER — Inpatient Hospital Stay: Payer: Medicare Other

## 2012-01-02 ENCOUNTER — Ambulatory Visit (HOSPITAL_BASED_OUTPATIENT_CLINIC_OR_DEPARTMENT_OTHER): Payer: Medicare Other

## 2012-01-02 ENCOUNTER — Encounter (INDEPENDENT_AMBULATORY_CARE_PROVIDER_SITE_OTHER): Payer: Self-pay | Admitting: General Surgery

## 2012-01-02 ENCOUNTER — Ambulatory Visit (INDEPENDENT_AMBULATORY_CARE_PROVIDER_SITE_OTHER): Payer: Medicare Other | Admitting: General Surgery

## 2012-01-02 VITALS — BP 140/90 | HR 60 | Temp 98.2°F | Resp 18 | Ht 74.5 in | Wt 275.8 lb

## 2012-01-02 VITALS — BP 138/80 | HR 82

## 2012-01-02 DIAGNOSIS — C20 Malignant neoplasm of rectum: Secondary | ICD-10-CM

## 2012-01-02 DIAGNOSIS — Z85048 Personal history of other malignant neoplasm of rectum, rectosigmoid junction, and anus: Secondary | ICD-10-CM

## 2012-01-02 DIAGNOSIS — C7800 Secondary malignant neoplasm of unspecified lung: Secondary | ICD-10-CM

## 2012-01-02 MED ORDER — SODIUM CHLORIDE 0.9 % IJ SOLN
10.0000 mL | INTRAMUSCULAR | Status: DC | PRN
  Administered 2012-01-02: 10 mL
  Filled 2012-01-02: qty 10

## 2012-01-02 MED ORDER — HEPARIN SOD (PORK) LOCK FLUSH 100 UNIT/ML IV SOLN
500.0000 [IU] | Freq: Once | INTRAVENOUS | Status: AC | PRN
  Administered 2012-01-02: 500 [IU]
  Filled 2012-01-02: qty 5

## 2012-01-02 NOTE — Progress Notes (Signed)
Subjective:     Patient ID: [name], male   DOB: December 27, 1960, 51 y.o.   MRN: [id number]  HPI 51 year old American male with rectal cancer comes in for long-term followup. I lsaw the patient in December 2012. At that time, he was doing well.  He had completed his adjuvant chemotherapy for his rectal cancer. He was due for his surveillance colonoscopy and CT scans. Unfortunately his CT scan of his chest abdomen and pelvis revealed new bilateral pulmonary nodules. A one-year colonoscopy in January 2013 revealed a new mass in his rectum 10 cm from the anal verge. The biopsy was consistent with invasive adenocarcinoma. He was represented at the GI tumor board meeting at that time. He restarted chemotherapy in February 2013. He has been getting cycles of FOLFOX.   He has no complaints today. He completed his 8th cycle on 12/17/11. He denies any fever, chills, nausea, vomiting, diarrhea, or constipation. He denies any melena or hematochezia. He reports good appetite. He denies any abdominal pain. The plan is for him to get restaged after his 10th cycle of chemotherapy  PMHx, PSHx, SOCHx, FAMHx, ALL reviewed and unchanged   Review of Systems As above     Objective:   Physical Exam BP 140/90  Pulse 60  Temp 98.2 F (36.8 C) (Temporal)  Resp 18  Ht 6' 2.5" (1.892 m)  Wt 275 lb 12.8 oz (125.102 kg)  BMI 34.94 kg/m2  Gen: alert, NAD, non-toxic appearing Pupils: equal, no scleral icterus Pulm: Lungs clear to auscultation, symmetric chest rise CV: regular rate and rhythm Abd: soft, nontender, nondistended. Well-healed incisions. No cellulitis. No incisional hernia Rectal: good tone. No masses. 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 It appears he is tolerating the chemotherapy fairly well. It is unfortunate he has had recurrence. Agree with a repeat staging CTs after the 10th cycle of chemotherapy. Followup 6 months or sooner depending on results of CT  Amberly Livas M. Andrey Campanile, MD, FACS General, Bariatric, & Minimally Invasive Surgery [facility], [place]

## 2012-01-13 ENCOUNTER — Other Ambulatory Visit: Payer: Self-pay | Admitting: *Deleted

## 2012-01-13 ENCOUNTER — Other Ambulatory Visit: Payer: Self-pay | Admitting: Oncology

## 2012-01-13 DIAGNOSIS — C20 Malignant neoplasm of rectum: Secondary | ICD-10-CM

## 2012-01-14 ENCOUNTER — Ambulatory Visit (HOSPITAL_BASED_OUTPATIENT_CLINIC_OR_DEPARTMENT_OTHER): Payer: Medicare Other | Admitting: Oncology

## 2012-01-14 ENCOUNTER — Other Ambulatory Visit (HOSPITAL_BASED_OUTPATIENT_CLINIC_OR_DEPARTMENT_OTHER): Payer: Medicare Other | Admitting: Lab

## 2012-01-14 ENCOUNTER — Ambulatory Visit (HOSPITAL_BASED_OUTPATIENT_CLINIC_OR_DEPARTMENT_OTHER): Payer: Medicare Other

## 2012-01-14 VITALS — BP 119/75 | HR 62 | Temp 98.2°F | Wt 274.2 lb

## 2012-01-14 DIAGNOSIS — C7800 Secondary malignant neoplasm of unspecified lung: Secondary | ICD-10-CM

## 2012-01-14 DIAGNOSIS — C20 Malignant neoplasm of rectum: Secondary | ICD-10-CM

## 2012-01-14 DIAGNOSIS — D649 Anemia, unspecified: Secondary | ICD-10-CM

## 2012-01-14 DIAGNOSIS — Z87898 Personal history of other specified conditions: Secondary | ICD-10-CM

## 2012-01-14 DIAGNOSIS — Z5111 Encounter for antineoplastic chemotherapy: Secondary | ICD-10-CM

## 2012-01-14 DIAGNOSIS — R911 Solitary pulmonary nodule: Secondary | ICD-10-CM

## 2012-01-14 LAB — COMPREHENSIVE METABOLIC PANEL
AST: 25 U/L (ref 0–37)
Albumin: 4 g/dL (ref 3.5–5.2)
Alkaline Phosphatase: 40 U/L (ref 39–117)
BUN: 12 mg/dL (ref 6–23)
Potassium: 3.9 mEq/L (ref 3.5–5.3)

## 2012-01-14 LAB — CBC WITH DIFFERENTIAL/PLATELET
Basophils Absolute: 0 10*3/uL (ref 0.0–0.1)
EOS%: 1.7 % (ref 0.0–7.0)
HGB: 12.2 g/dL — ABNORMAL LOW (ref 13.0–17.1)
MCH: 31.5 pg (ref 27.2–33.4)
MCV: 91.5 fL (ref 79.3–98.0)
MONO%: 15.9 % — ABNORMAL HIGH (ref 0.0–14.0)
RBC: 3.87 10*6/uL — ABNORMAL LOW (ref 4.20–5.82)
RDW: 14.3 % (ref 11.0–14.6)

## 2012-01-14 MED ORDER — PALONOSETRON HCL INJECTION 0.25 MG/5ML
0.2500 mg | Freq: Once | INTRAVENOUS | Status: AC
  Administered 2012-01-14: 0.25 mg via INTRAVENOUS

## 2012-01-14 MED ORDER — SODIUM CHLORIDE 0.9 % IV SOLN
2400.0000 mg/m2 | INTRAVENOUS | Status: DC
  Administered 2012-01-14: 6000 mg via INTRAVENOUS
  Filled 2012-01-14: qty 120

## 2012-01-14 MED ORDER — LEUCOVORIN CALCIUM INJECTION 350 MG
400.0000 mg/m2 | Freq: Once | INTRAVENOUS | Status: AC
  Administered 2012-01-14: 1004 mg via INTRAVENOUS
  Filled 2012-01-14: qty 50.2

## 2012-01-14 MED ORDER — DEXTROSE 5 % IV SOLN
Freq: Once | INTRAVENOUS | Status: AC
  Administered 2012-01-14: 13:00:00 via INTRAVENOUS

## 2012-01-14 MED ORDER — DEXAMETHASONE SODIUM PHOSPHATE 10 MG/ML IJ SOLN
10.0000 mg | Freq: Once | INTRAMUSCULAR | Status: AC
  Administered 2012-01-14: 10 mg via INTRAVENOUS

## 2012-01-14 MED ORDER — FLUOROURACIL CHEMO INJECTION 2.5 GM/50ML
400.0000 mg/m2 | Freq: Once | INTRAVENOUS | Status: AC
  Administered 2012-01-14: 1000 mg via INTRAVENOUS
  Filled 2012-01-14: qty 20

## 2012-01-14 MED ORDER — OXALIPLATIN CHEMO INJECTION 100 MG/20ML
60.0000 mg/m2 | Freq: Once | INTRAVENOUS | Status: AC
  Administered 2012-01-14: 150 mg via INTRAVENOUS
  Filled 2012-01-14: qty 30

## 2012-01-14 NOTE — Patient Instructions (Signed)
[facility] Discharge Instructions for Patients Receiving Chemotherapy  Today you received the following chemotherapy agents :Oxaliplatin, Leucovorin, 5 FU  To help prevent nausea and vomiting after your treatment, we encourage you to take your nausea medication : Compazine 10 mg every 6 hours as needed for nausea    If you develop nausea and vomiting that is not controlled by your nausea medication, call the clinic. If it is after clinic hours your family physician or the after hours number for the clinic or go to the Emergency Department.   BELOW ARE SYMPTOMS THAT SHOULD BE REPORTED IMMEDIATELY:  *FEVER GREATER THAN 100.5 F  *CHILLS WITH OR WITHOUT FEVER  NAUSEA AND VOMITING THAT IS NOT CONTROLLED WITH YOUR NAUSEA MEDICATION  *UNUSUAL SHORTNESS OF BREATH  *UNUSUAL BRUISING OR BLEEDING  TENDERNESS IN MOUTH AND THROAT WITH OR WITHOUT PRESENCE OF ULCERS  *URINARY PROBLEMS  *BOWEL PROBLEMS  UNUSUAL RASH Items with * indicate a potential emergency and should be followed up as soon as possible.   Feel free to call the clinic you have any questions or concerns. The clinic phone number is [phone].   I have been informed and understand all the instructions given to me. I know to contact the clinic, my physician, or go to the Emergency Department if any problems should occur. I do not have any questions at this time, but understand that I may call the clinic during office hours   should I have any questions or need assistance in obtaining follow up care.    __________________________________________  _____________  __________ Signature of Patient or Authorized Representative            Date                   Time    __________________________________________ Nurse's Signature

## 2012-01-14 NOTE — Progress Notes (Signed)
   [facility]    OFFICE PROGRESS NOTE   INTERVAL HISTORY:   He returns as scheduled. He completed another cycle of FOLFOX on 12/31/2011. He denies nausea, mouth sores, and diarrhea following chemotherapy. No neuropathy symptoms. The left leg edema has improved.  Objective:  Vital signs in last 24 hours:  Blood pressure 119/75, pulse 62, temperature 98.2 F (36.8 C), temperature source Oral, weight 274 lb 3.2 oz (124.376 kg).    HEENT: No thrush or ulcers Resp: Lungs clear bilaterally Cardio: Regular rate and rhythm GI: Nontender, no hepato- Vascular: The left lower leg is larger than the right side Neuro: Mild to moderate decrease in vibratory sense at the fingertips bilaterally  Skin: Hyperpigmentation over the hands   Portacath/PICC-without erythema  Lab Results:  Lab Results  Component Value Date   WBC 3.0* 01/14/2012   HGB 12.2* 01/14/2012   HCT 35.4* 01/14/2012   MCV 91.5 01/14/2012   PLT 110* 01/14/2012   ANC 2.0    Medications: I have reviewed the patient's current medications.  Assessment/Plan: 1.Rectal cancer, clinical uT3 uN0, status post concurrent chemotherapy/radiation with radiation completed March 11, 2010. He is status post laparoscopic-assisted low anterior resection with diverting loop ileostomy 05/30/2010 with final pathology showing no evidence of malignancy. He declined adjuvant chemotherapy.  2. History of sigmoid colon polyp at 20.0 cm status post biopsy confirming high-grade dysplasia.  3. History of mild anemia likely related to chemotherapy/radiation and rectal bleeding.  4. Mental disability.  5. History of bilateral leg edema status post negative Doppler of the left leg December 19, 2009.  6. Family history of multiple cancers.  7. Colonoscopy 07/14/2011 with finding of a distal rectal mass status post biopsy with pathology showing invasive colorectal adenocarcinoma, moderately differentiated. Biopsy of a polyp in the ascending  colon showed a tubular adenoma.  8. CT abdomen and pelvis 06/13/2011 with interval development of multiple pulmonary nodules in both lung bases. Multiple bilateral lung nodules confirmed on a CT of the chest 08/06/2011. Chemotherapy initiated with FOLFOX on 08/20/2011. Restaging CT of the chest on 10/27/2011 after 5 cycles of FOLFOX showed a decrease in the size and number of pulmonary nodules. He completed cycle 9 FOLFOX on 12/31/2011. 9. Status post Port-A-Cath placement 08/14/2011.  10. CEA 3.7 on 08/07/2011.  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 No symptoms at present. 12. Neutropenia/thrombocytopenia 11/26/2011 secondary to chemotherapy; persistent neutropenia on 12/03/2011. Oxaliplatin was dose reduced beginning with cycle 8.     Disposition:  He appears stable. He will complete cycle 10 of FOLFOX today. He will return for an office visit on 02/04/2012. He will be scheduled for another cycle of chemotherapy that day. Mr. Birr will undergo a restaging CT of the chest prior to the next office visit. appears to be developing oxaliplatin neuropathy. This does not interfere with activity or call symptoms at present. We will need to consider deleting or dose reducing the oxaliplatin after the restaging evaluation.  Thornton Papas, MD  01/14/2012  11:30 AM

## 2012-01-16 ENCOUNTER — Ambulatory Visit (HOSPITAL_BASED_OUTPATIENT_CLINIC_OR_DEPARTMENT_OTHER): Payer: Medicare Other

## 2012-01-16 VITALS — BP 114/71 | HR 61 | Temp 98.2°F

## 2012-01-16 DIAGNOSIS — C7800 Secondary malignant neoplasm of unspecified lung: Secondary | ICD-10-CM

## 2012-01-16 DIAGNOSIS — Z452 Encounter for adjustment and management of vascular access device: Secondary | ICD-10-CM

## 2012-01-16 DIAGNOSIS — C20 Malignant neoplasm of rectum: Secondary | ICD-10-CM

## 2012-01-16 MED ORDER — SODIUM CHLORIDE 0.9 % IJ SOLN
10.0000 mL | INTRAMUSCULAR | Status: DC | PRN
  Administered 2012-01-16: 10 mL
  Filled 2012-01-16: qty 10

## 2012-01-16 MED ORDER — HEPARIN SOD (PORK) LOCK FLUSH 100 UNIT/ML IV SOLN
500.0000 [IU] | Freq: Once | INTRAVENOUS | Status: AC | PRN
  Administered 2012-01-16: 500 [IU]
  Filled 2012-01-16: qty 5

## 2012-01-16 NOTE — Patient Instructions (Signed)
Call the MD if you have any problems. 

## 2012-01-26 ENCOUNTER — Telehealth: Payer: Self-pay | Admitting: Oncology

## 2012-01-26 NOTE — Telephone Encounter (Signed)
lmonvm for pt on both home/cell phones re appts 8/14 and ct 8/12. Schedule mailed.

## 2012-02-01 ENCOUNTER — Other Ambulatory Visit: Payer: Self-pay | Admitting: Oncology

## 2012-02-02 ENCOUNTER — Ambulatory Visit (HOSPITAL_COMMUNITY)
Admission: RE | Admit: 2012-02-02 | Discharge: 2012-02-02 | Disposition: A | Discharge status: Home / Self Care | Payer: Medicare Other | Source: Ambulatory Visit | Attending: Oncology | Admitting: Oncology

## 2012-02-02 DIAGNOSIS — Z923 Personal history of irradiation: Secondary | ICD-10-CM | POA: Insufficient documentation

## 2012-02-02 DIAGNOSIS — C7800 Secondary malignant neoplasm of unspecified lung: Secondary | ICD-10-CM

## 2012-02-02 DIAGNOSIS — C20 Malignant neoplasm of rectum: Secondary | ICD-10-CM | POA: Insufficient documentation

## 2012-02-02 DIAGNOSIS — Z79899 Other long term (current) drug therapy: Secondary | ICD-10-CM | POA: Insufficient documentation

## 2012-02-02 DIAGNOSIS — R918 Other nonspecific abnormal finding of lung field: Secondary | ICD-10-CM | POA: Insufficient documentation

## 2012-02-02 MED ORDER — IOHEXOL 300 MG/ML  SOLN
80.0000 mL | Freq: Once | INTRAMUSCULAR | Status: AC | PRN
  Administered 2012-02-02: 80 mL via INTRAVENOUS

## 2012-02-04 ENCOUNTER — Other Ambulatory Visit (HOSPITAL_BASED_OUTPATIENT_CLINIC_OR_DEPARTMENT_OTHER): Payer: Medicare Other | Admitting: Lab

## 2012-02-04 ENCOUNTER — Ambulatory Visit (HOSPITAL_BASED_OUTPATIENT_CLINIC_OR_DEPARTMENT_OTHER): Payer: Medicare Other | Admitting: Nurse Practitioner

## 2012-02-04 ENCOUNTER — Ambulatory Visit (HOSPITAL_BASED_OUTPATIENT_CLINIC_OR_DEPARTMENT_OTHER): Payer: Medicare Other

## 2012-02-04 VITALS — BP 127/79 | HR 69 | Temp 96.8°F | Resp 20 | Ht 74.5 in | Wt 274.3 lb

## 2012-02-04 DIAGNOSIS — C20 Malignant neoplasm of rectum: Secondary | ICD-10-CM

## 2012-02-04 DIAGNOSIS — Z809 Family history of malignant neoplasm, unspecified: Secondary | ICD-10-CM

## 2012-02-04 DIAGNOSIS — C7800 Secondary malignant neoplasm of unspecified lung: Secondary | ICD-10-CM

## 2012-02-04 DIAGNOSIS — R911 Solitary pulmonary nodule: Secondary | ICD-10-CM

## 2012-02-04 DIAGNOSIS — Z5111 Encounter for antineoplastic chemotherapy: Secondary | ICD-10-CM

## 2012-02-04 DIAGNOSIS — D702 Other drug-induced agranulocytosis: Secondary | ICD-10-CM

## 2012-02-04 LAB — COMPREHENSIVE METABOLIC PANEL
AST: 29 U/L (ref 0–37)
Albumin: 4.1 g/dL (ref 3.5–5.2)
Alkaline Phosphatase: 43 U/L (ref 39–117)
Potassium: 4 mEq/L (ref 3.5–5.3)
Sodium: 142 mEq/L (ref 135–145)
Total Bilirubin: 0.4 mg/dL (ref 0.3–1.2)
Total Protein: 6.6 g/dL (ref 6.0–8.3)

## 2012-02-04 LAB — CBC WITH DIFFERENTIAL/PLATELET
BASO%: 0.7 % (ref 0.0–2.0)
EOS%: 2.7 % (ref 0.0–7.0)
LYMPH%: 20.6 % (ref 14.0–49.0)
MCH: 32.3 pg (ref 27.2–33.4)
MCHC: 33.5 g/dL (ref 32.0–36.0)
MCV: 96.6 fL (ref 79.3–98.0)
MONO%: 21.2 % — ABNORMAL HIGH (ref 0.0–14.0)
NEUT#: 1.7 10*3/uL (ref 1.5–6.5)
Platelets: 106 10*3/uL — ABNORMAL LOW (ref 140–400)
RBC: 3.93 10*6/uL — ABNORMAL LOW (ref 4.20–5.82)
RDW: 14.4 % (ref 11.0–14.6)

## 2012-02-04 MED ORDER — DEXAMETHASONE SODIUM PHOSPHATE 10 MG/ML IJ SOLN: 10.0000 mg | Freq: Once | INTRAMUSCULAR | Status: DC

## 2012-02-04 MED ORDER — DEXTROSE 5 % IV SOLN: Freq: Once | INTRAVENOUS | Status: DC

## 2012-02-04 MED ORDER — FLUOROURACIL CHEMO INJECTION 2.5 GM/50ML
400.0000 mg/m2 | Freq: Once | INTRAVENOUS | Status: AC
  Administered 2012-02-04: 1000 mg via INTRAVENOUS
  Filled 2012-02-04: qty 20

## 2012-02-04 MED ORDER — LEUCOVORIN CALCIUM INJECTION 350 MG
400.0000 mg/m2 | Freq: Once | INTRAMUSCULAR | Status: AC
  Administered 2012-02-04: 1004 mg via INTRAVENOUS
  Filled 2012-02-04: qty 50.2

## 2012-02-04 MED ORDER — SODIUM CHLORIDE 0.9 % IV SOLN
2400.0000 mg/m2 | INTRAVENOUS | Status: DC
  Administered 2012-02-04: 6000 mg via INTRAVENOUS
  Filled 2012-02-04: qty 120

## 2012-02-04 MED ORDER — PALONOSETRON HCL INJECTION 0.25 MG/5ML: 0.2500 mg | Freq: Once | INTRAVENOUS | Status: DC

## 2012-02-04 MED ORDER — OXALIPLATIN CHEMO INJECTION 100 MG/20ML: 60.0000 mg/m2 | Freq: Once | INTRAVENOUS | Status: DC

## 2012-02-04 NOTE — Patient Instructions (Signed)
[facility] Discharge Instructions for Patients Receiving Chemotherapy  Today you received the following chemotherapy agents 36fu, leucovorin, oxaliplatin To help prevent nausea and vomiting after your treatment, we encourage you to take your nausea medication Begin taking it atas often as prescribed    If you develop nausea and vomiting that is not controlled by your nausea medication, call the clinic. If it is after clinic hours your family physician or the after hours number for the clinic or go to the Emergency Department.   BELOW ARE SYMPTOMS THAT SHOULD BE REPORTED IMMEDIATELY:  *FEVER GREATER THAN 100.5 F  *CHILLS WITH OR WITHOUT FEVER  NAUSEA AND VOMITING THAT IS NOT CONTROLLED WITH YOUR NAUSEA MEDICATION  *UNUSUAL SHORTNESS OF BREATH  *UNUSUAL BRUISING OR BLEEDING  TENDERNESS IN MOUTH AND THROAT WITH OR WITHOUT PRESENCE OF ULCERS  *URINARY PROBLEMS  *BOWEL PROBLEMS  UNUSUAL RASH Items with * indicate a potential emergency and should be followed up as soon as possible.  One of the nurses will contact you 24 hours after your treatment. Please let the nurse know about any problems that you may have experienced. Feel free to call the clinic you have any questions or concerns. The clinic phone number is [phone].   I have been informed and understand all the instructions given to me. I know to contact the clinic, my physician, or go to the Emergency Department if any problems should occur. I do not have any questions at this time, but understand that I may call the clinic during office hours   should I have any questions or need assistance in obtaining follow up care.    __________________________________________  _____________  __________ Signature of Patient or Authorized Representative            Date                   Time    __________________________________________ Nurse's Signature

## 2012-02-04 NOTE — Progress Notes (Signed)
OFFICE PROGRESS NOTE  Interval history:  [name] returns as scheduled. He completed cycle 10 of FOLFOX on 01/14/2012. Restaging chest CT 02/02/2012 showed stable to improved disease in the lungs. There were no new lung nodules.  [name] feels well. He denies nausea/vomiting. No mouth sores. No diarrhea. He denies numbness or tingling in his hands or feet. He continues to have a good appetite.  Objective: There were no vitals taken for this visit.  Oropharynx is without thrush or ulceration. Lungs are clear. Regular cardiac rhythm. Port-A-Cath site is without erythema. Abdomen is soft and nontender. No hepatomegaly. Trace lower leg edema bilaterally left greater than right. Mild to moderate decrease in vibratory sense over the fingertips per tuning fork exam.  Lab Results: Lab Results  Component Value Date   WBC 3.1* 02/04/2012   HGB 12.7* 02/04/2012   HCT 37.9* 02/04/2012   MCV 96.6 02/04/2012   PLT 106* 02/04/2012    Chemistry:    Chemistry      Component Value Date/Time   NA 142 01/14/2012 1040   NA 141 10/27/2011 0857   K 3.9 01/14/2012 1040   K 4.4 10/27/2011 0857   CL 107 01/14/2012 1040   CL 98 10/27/2011 0857   CO2 28 01/14/2012 1040   CO2 29 10/27/2011 0857   BUN 12 01/14/2012 1040   BUN 17 10/27/2011 0857   CREATININE 1.02 01/14/2012 1040   CREATININE 1.1 10/27/2011 0857      Component Value Date/Time   CALCIUM 9.1 01/14/2012 1040   CALCIUM 9.0 10/27/2011 0857   ALKPHOS 40 01/14/2012 1040   ALKPHOS 41 10/27/2011 0857   AST 25 01/14/2012 1040   AST 31 10/27/2011 0857   ALT 19 01/14/2012 1040   BILITOT 0.5 01/14/2012 1040   BILITOT 0.70 10/27/2011 0857       Studies/Results: Ct Chest W Contrast  02/02/2012  *RADIOLOGY REPORT*  Clinical Data: Restaging of rectal cancer, completed radiation therapy, ongoing chemotherapy  CT CHEST WITH CONTRAST  Technique:  Multidetector CT imaging of the chest was performed following the standard protocol during bolus administration of intravenous  contrast.  Contrast: 80mL OMNIPAQUE IOHEXOL 300 MG/ML  SOLN  Comparison: Chest CT - 10/27/2011; 08/06/2011  Findings:  Overall findings compatible with stable disease to minimally continued treatment response.   No new pulmonary nodules.  Approximately 3 mm nodule within the left lower lobe (image 37), was definitely seen than on the prior examination, however appears decreased from more remote examination performed 02/20 13.  Dominant pulmonary nodule within the right costophrenic angle now measures 5 x 4 mm (image 43), previously, 9 x 7 mm.  An additional pulmonary nodule within the right lower lobe now measures 3 mm in diameter (image 44, series 5), previously, 6 mm.  Unchanged 6 mm pulmonary nodule in the right lower lobe (image 42).  Scattered pulmonary nodules measuring less than 4 mm in diameter in the remainder of the bilateral lungs are unchanged - right middle lobe - images 28 and 35; right upper lobe - images 26 and 27; right lower lobe - image 36; left upper lobe - image 23; lingula  - image 35; left lower lobe - image 40.  No focal airspace opacities.  No pleural effusion or pneumothorax. Central airways are patent.  No mediastinal, hilar or axillary lymphadenopathy.  Normal heart size.  No pericardial effusion.  Normal caliber of the thoracic aorta.  Right jugular approaching central venous catheter tip terminates at the superior cavoatrial junction.  Although this examination was not tailored for evaluation of the pulmonary arteries, there is no intraluminal filling defect within the pulmonary arterial tree.  Incidental imaging of the upper abdomen is normal.  No acute or aggressive osseous abnormalities.  IMPRESSION: Overall findings compatible with stable disease to minimally continued treatment response.  No new pulmonary nodules.  Original Report Authenticated By: Waynard Reeds, M.D.    Medications: I have reviewed the patient's current medications.  Assessment/Plan:  1.Rectal cancer,  clinical uT3 uN0, status post concurrent chemotherapy/radiation with radiation completed March 11, 2010. He is status post laparoscopic-assisted low anterior resection with diverting loop ileostomy 05/30/2010 with final pathology showing no evidence of malignancy. He declined adjuvant chemotherapy.  2. History of sigmoid colon polyp at 20.0 cm status post biopsy confirming high-grade dysplasia.  3. History of mild anemia likely related to chemotherapy/radiation and rectal bleeding.  4. Mental disability.  5. History of bilateral leg edema status post negative Doppler of the left leg 12/19/2009.  6. Family history of multiple cancers.  7. Colonoscopy 07/14/2011 with finding of a distal rectal mass status post biopsy with pathology showing invasive colorectal adenocarcinoma, moderately differentiated. Biopsy of a polyp in the ascending colon showed a tubular adenoma.  8. CT abdomen and pelvis 06/13/2011 with interval development of multiple pulmonary nodules in both lung bases. Multiple bilateral lung nodules confirmed on a CT of the chest 08/06/2011. Chemotherapy initiated with FOLFOX on 08/20/2011. Restaging CT of the chest on 10/27/2011 after 5 cycles of FOLFOX showed a decrease in the size and number of pulmonary nodules. He completed cycle 10 FOLFOX on 01/14/2012. Restaging chest CT on 02/02/2012 showed stable to improved disease in the lungs and no new lung nodules. 9. Status post Port-A-Cath placement 08/14/2011.  10. CEA 3.7 on 08/07/2011.  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 12. Neutropenia/thrombocytopenia 11/26/2011 secondary to chemotherapy; persistent neutropenia on 12/03/2011. Oxaliplatin was dose reduced beginning with cycle 8.   Disposition-[name] appears stable. He has completed 10 cycles of FOLFOX chemotherapy. Plan to continue with 5-FU/leucovorin as per the FOLFOX regimen with discontinuation of oxaliplatin. He will complete cycle 11 today. He will  return for a followup visit and cycle 12 in 2 weeks. He will contact the office in the interim with any problems.  Plan reviewed with Dr. Truett Perna.   Lonna Cobb ANP/GNP-BC

## 2012-02-06 ENCOUNTER — Ambulatory Visit (HOSPITAL_BASED_OUTPATIENT_CLINIC_OR_DEPARTMENT_OTHER): Payer: Medicare Other

## 2012-02-06 ENCOUNTER — Telehealth: Payer: Self-pay | Admitting: *Deleted

## 2012-02-06 VITALS — BP 132/73 | HR 69 | Temp 98.2°F | Resp 20

## 2012-02-06 DIAGNOSIS — C20 Malignant neoplasm of rectum: Secondary | ICD-10-CM

## 2012-02-06 DIAGNOSIS — Z452 Encounter for adjustment and management of vascular access device: Secondary | ICD-10-CM

## 2012-02-06 MED ORDER — SODIUM CHLORIDE 0.9 % IJ SOLN
10.0000 mL | INTRAMUSCULAR | Status: DC | PRN
  Administered 2012-02-06: 10 mL
  Filled 2012-02-06: qty 10

## 2012-02-06 MED ORDER — HEPARIN SOD (PORK) LOCK FLUSH 100 UNIT/ML IV SOLN
500.0000 [IU] | Freq: Once | INTRAVENOUS | Status: AC | PRN
  Administered 2012-02-06: 500 [IU]
  Filled 2012-02-06: qty 5

## 2012-02-06 NOTE — Patient Instructions (Signed)
Patient instructed to call MD with any problems 

## 2012-02-06 NOTE — Telephone Encounter (Signed)
Per staff message and POF I have scheduled appt.  JMW  

## 2012-02-12 ENCOUNTER — Other Ambulatory Visit: Payer: Self-pay | Admitting: Certified Registered Nurse Anesthetist

## 2012-02-17 ENCOUNTER — Other Ambulatory Visit: Payer: Self-pay | Admitting: Oncology

## 2012-02-18 ENCOUNTER — Telehealth: Payer: Self-pay | Admitting: Oncology

## 2012-02-18 ENCOUNTER — Other Ambulatory Visit (HOSPITAL_BASED_OUTPATIENT_CLINIC_OR_DEPARTMENT_OTHER): Payer: Medicare Other

## 2012-02-18 ENCOUNTER — Ambulatory Visit (HOSPITAL_BASED_OUTPATIENT_CLINIC_OR_DEPARTMENT_OTHER): Payer: Medicare Other | Admitting: Nurse Practitioner

## 2012-02-18 ENCOUNTER — Ambulatory Visit (HOSPITAL_BASED_OUTPATIENT_CLINIC_OR_DEPARTMENT_OTHER): Payer: Medicare Other

## 2012-02-18 VITALS — BP 124/73 | HR 70 | Temp 97.9°F | Resp 20 | Ht 74.5 in | Wt 282.8 lb

## 2012-02-18 DIAGNOSIS — C7800 Secondary malignant neoplasm of unspecified lung: Secondary | ICD-10-CM

## 2012-02-18 DIAGNOSIS — Z5111 Encounter for antineoplastic chemotherapy: Secondary | ICD-10-CM

## 2012-02-18 DIAGNOSIS — C20 Malignant neoplasm of rectum: Secondary | ICD-10-CM

## 2012-02-18 DIAGNOSIS — D696 Thrombocytopenia, unspecified: Secondary | ICD-10-CM

## 2012-02-18 LAB — CBC WITH DIFFERENTIAL/PLATELET
Basophils Absolute: 0 10*3/uL (ref 0.0–0.1)
EOS%: 1.5 % (ref 0.0–7.0)
Eosinophils Absolute: 0.1 10*3/uL (ref 0.0–0.5)
LYMPH%: 11.9 % — ABNORMAL LOW (ref 14.0–49.0)
MCH: 31.6 pg (ref 27.2–33.4)
MCV: 94.3 fL (ref 79.3–98.0)
MONO%: 19.2 % — ABNORMAL HIGH (ref 0.0–14.0)
NEUT#: 2.7 10*3/uL (ref 1.5–6.5)
Platelets: 127 10*3/uL — ABNORMAL LOW (ref 140–400)
RBC: 3.7 10*6/uL — ABNORMAL LOW (ref 4.20–5.82)
RDW: 14.7 % — ABNORMAL HIGH (ref 11.0–14.6)

## 2012-02-18 LAB — COMPREHENSIVE METABOLIC PANEL (CC13)
Alkaline Phosphatase: 44 U/L (ref 40–150)
CO2: 27 mEq/L (ref 22–29)
Creatinine: 0.8 mg/dL (ref 0.7–1.3)
Glucose: 89 mg/dl (ref 70–99)
Sodium: 140 mEq/L (ref 136–145)
Total Bilirubin: 0.3 mg/dL (ref 0.20–1.20)

## 2012-02-18 MED ORDER — LEUCOVORIN CALCIUM INJECTION 350 MG
400.0000 mg/m2 | Freq: Once | INTRAVENOUS | Status: AC
  Administered 2012-02-18: 1004 mg via INTRAVENOUS
  Filled 2012-02-18: qty 50.2

## 2012-02-18 MED ORDER — FLUOROURACIL CHEMO INJECTION 2.5 GM/50ML
400.0000 mg/m2 | Freq: Once | INTRAVENOUS | Status: AC
  Administered 2012-02-18: 1000 mg via INTRAVENOUS
  Filled 2012-02-18: qty 20

## 2012-02-18 MED ORDER — SODIUM CHLORIDE 0.9 % IV SOLN
2400.0000 mg/m2 | INTRAVENOUS | Status: DC
  Administered 2012-02-18: 6000 mg via INTRAVENOUS
  Filled 2012-02-18: qty 120

## 2012-02-18 NOTE — Telephone Encounter (Signed)
Gave pt appt calendar for September and October 2013 lab and MD

## 2012-02-18 NOTE — Patient Instructions (Signed)
[facility] Discharge Instructions for Patients Receiving Chemotherapy  Today you received the following chemotherapy agents 5 fu, leucovorin  To help prevent nausea and vomiting after your treatment, we encourage you to take your nausea medication as directed by MD.  If you develop nausea and vomiting that is not controlled by your nausea medication, call the clinic. If it is after clinic hours your family physician or the after hours number for the clinic or go to the Emergency Department.   BELOW ARE SYMPTOMS THAT SHOULD BE REPORTED IMMEDIATELY:  *FEVER GREATER THAN 100.5 F  *CHILLS WITH OR WITHOUT FEVER  NAUSEA AND VOMITING THAT IS NOT CONTROLLED WITH YOUR NAUSEA MEDICATION  *UNUSUAL SHORTNESS OF BREATH  *UNUSUAL BRUISING OR BLEEDING  TENDERNESS IN MOUTH AND THROAT WITH OR WITHOUT PRESENCE OF ULCERS  *URINARY PROBLEMS  *BOWEL PROBLEMS  UNUSUAL RASH Items with * indicate a potential emergency and should be followed up as soon as possible.  One of the nurses will contact you 24 hours after your treatment. Please let the nurse know about any problems that you may have experienced. Feel free to call the clinic you have any questions or concerns. The clinic phone number is [phone].   I have been informed and understand all the instructions given to me. I know to contact the clinic, my physician, or go to the Emergency Department if any problems should occur. I do not have any questions at this time, but understand that I may call the clinic during office hours   should I have any questions or need assistance in obtaining follow up care.    __________________________________________  _____________  __________ Signature of Patient or Authorized Representative            Date                   Time    __________________________________________ Nurse's Signature

## 2012-02-18 NOTE — Progress Notes (Signed)
OFFICE PROGRESS NOTE  Interval history:  [name] returns as scheduled. He completed cycle 11 of FOLFOX beginning 02/04/2012. Oxaliplatin was deleted from the regimen beginning with cycle 11. He feels well. No nausea or vomiting. No diarrhea. No mouth sores. He denies hand or foot pain or redness. He denies numbness or tingling in the hands or feet. He has a good appetite. He is gaining weight.   Objective: Blood pressure 124/73, pulse 70, temperature 97.9 F (36.6 C), temperature source Oral, resp. rate 20, height 6' 2.5" (1.892 m), weight 282 lb 12.8 oz (128.277 kg).  Oropharynx is without thrush or ulceration. Lungs clear. Regular cardiac rhythm. Port-A-Cath site is without erythema. Abdomen soft and nontender. No hepatomegaly. Trace edema at the lower legs bilaterally left greater than right.  Lab Results: Lab Results  Component Value Date   WBC 4.0 02/18/2012   HGB 11.7* 02/18/2012   HCT 34.9* 02/18/2012   MCV 94.3 02/18/2012   PLT 127* 02/18/2012    Chemistry:    Chemistry      Component Value Date/Time   NA 142 02/04/2012 0941   NA 141 10/27/2011 0857   K 4.0 02/04/2012 0941   K 4.4 10/27/2011 0857   CL 105 02/04/2012 0941   CL 98 10/27/2011 0857   CO2 28 02/04/2012 0941   CO2 29 10/27/2011 0857   BUN 14 02/04/2012 0941   BUN 17 10/27/2011 0857   CREATININE 0.91 02/04/2012 0941   CREATININE 1.1 10/27/2011 0857      Component Value Date/Time   CALCIUM 8.9 02/04/2012 0941   CALCIUM 9.0 10/27/2011 0857   ALKPHOS 43 02/04/2012 0941   ALKPHOS 41 10/27/2011 0857   AST 29 02/04/2012 0941   AST 31 10/27/2011 0857   ALT 29 02/04/2012 0941   BILITOT 0.4 02/04/2012 0941   BILITOT 0.70 10/27/2011 0857       Studies/Results: Ct Chest W Contrast  02/02/2012  *RADIOLOGY REPORT*  Clinical Data: Restaging of rectal cancer, completed radiation therapy, ongoing chemotherapy  CT CHEST WITH CONTRAST  Technique:  Multidetector CT imaging of the chest was performed following the standard protocol during bolus  administration of intravenous contrast.  Contrast: 80mL OMNIPAQUE IOHEXOL 300 MG/ML  SOLN  Comparison: Chest CT - 10/27/2011; 08/06/2011  Findings:  Overall findings compatible with stable disease to minimally continued treatment response.   No new pulmonary nodules.  Approximately 3 mm nodule within the left lower lobe (image 37), was definitely seen than on the prior examination, however appears decreased from more remote examination performed 02/20 13.  Dominant pulmonary nodule within the right costophrenic angle now measures 5 x 4 mm (image 43), previously, 9 x 7 mm.  An additional pulmonary nodule within the right lower lobe now measures 3 mm in diameter (image 44, series 5), previously, 6 mm.  Unchanged 6 mm pulmonary nodule in the right lower lobe (image 42).  Scattered pulmonary nodules measuring less than 4 mm in diameter in the remainder of the bilateral lungs are unchanged - right middle lobe - images 28 and 35; right upper lobe - images 26 and 27; right lower lobe - image 36; left upper lobe - image 23; lingula  - image 35; left lower lobe - image 40.  No focal airspace opacities.  No pleural effusion or pneumothorax. Central airways are patent.  No mediastinal, hilar or axillary lymphadenopathy.  Normal heart size.  No pericardial effusion.  Normal caliber of the thoracic aorta.  Right jugular approaching central venous  catheter tip terminates at the superior cavoatrial junction.  Although this examination was not tailored for evaluation of the pulmonary arteries, there is no intraluminal filling defect within the pulmonary arterial tree.  Incidental imaging of the upper abdomen is normal.  No acute or aggressive osseous abnormalities.  IMPRESSION: Overall findings compatible with stable disease to minimally continued treatment response.  No new pulmonary nodules.  Original Report Authenticated By: Waynard Reeds, M.D.    Medications: I have reviewed the patient's current  medications.  Assessment/Plan:  1.Rectal cancer, clinical uT3 uN0, status post concurrent chemotherapy/radiation with radiation completed March 11, 2010. He is status post laparoscopic-assisted low anterior resection with diverting loop ileostomy 05/30/2010 with final pathology showing no evidence of malignancy. He declined adjuvant chemotherapy.  2. History of sigmoid colon polyp at 20.0 cm status post biopsy confirming high-grade dysplasia.  3. History of mild anemia likely related to chemotherapy/radiation and rectal bleeding.  4. Mental disability.  5. History of bilateral leg edema status post negative Doppler of the left leg 12/19/2009.  6. Family history of multiple cancers.  7. Colonoscopy 07/14/2011 with finding of a distal rectal mass status post biopsy with pathology showing invasive colorectal adenocarcinoma, moderately differentiated. Biopsy of a polyp in the ascending colon showed a tubular adenoma.  8. CT abdomen and pelvis 06/13/2011 with interval development of multiple pulmonary nodules in both lung bases. Multiple bilateral lung nodules confirmed on a CT of the chest 08/06/2011. Chemotherapy initiated with FOLFOX on 08/20/2011. Restaging CT of the chest on 10/27/2011 after 5 cycles of FOLFOX showed a decrease in the size and number of pulmonary nodules. He completed cycle 10 FOLFOX on 01/14/2012. Restaging chest CT on 02/02/2012 showed stable to improved disease in the lungs and no new lung nodules. Treatment continued with 5-FU/leucovorin as per the FOLFOX regimen on a 2 week schedule (oxaliplatin deleted from the regimen beginning with cycle 11). 9. Status post Port-A-Cath placement 08/14/2011.  10. CEA 3.7 on 08/07/2011.  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  12. Neutropenia/thrombocytopenia 11/26/2011 secondary to chemotherapy; persistent neutropenia on 12/03/2011. Oxaliplatin was dose reduced beginning with cycle 8. Oxaliplatin deleted from the  regimen beginning with cycle 11.  Disposition-Mr. Wojtaszek appears stable. Plan to continue with 5-FU/leucovorin on a 2 week schedule. He will be treated today and again in 2 weeks. He will return for a followup visit and treatment in 4 weeks. He will contact the office in the interim with any problems.  Plan reviewed with Dr. Truett Perna.   Lonna Cobb ANP/GNP-BC

## 2012-02-20 ENCOUNTER — Ambulatory Visit (HOSPITAL_BASED_OUTPATIENT_CLINIC_OR_DEPARTMENT_OTHER): Payer: Medicare Other

## 2012-02-20 DIAGNOSIS — Z452 Encounter for adjustment and management of vascular access device: Secondary | ICD-10-CM

## 2012-02-20 DIAGNOSIS — C20 Malignant neoplasm of rectum: Secondary | ICD-10-CM

## 2012-02-20 DIAGNOSIS — C7800 Secondary malignant neoplasm of unspecified lung: Secondary | ICD-10-CM

## 2012-02-20 MED ORDER — SODIUM CHLORIDE 0.9 % IJ SOLN
10.0000 mL | INTRAMUSCULAR | Status: DC | PRN
  Administered 2012-02-20: 10 mL
  Filled 2012-02-20: qty 10

## 2012-02-20 MED ORDER — HEPARIN SOD (PORK) LOCK FLUSH 100 UNIT/ML IV SOLN
500.0000 [IU] | Freq: Once | INTRAVENOUS | Status: AC | PRN
  Administered 2012-02-20: 500 [IU]
  Filled 2012-02-20: qty 5

## 2012-03-02 ENCOUNTER — Other Ambulatory Visit: Payer: Self-pay | Admitting: Oncology

## 2012-03-03 ENCOUNTER — Ambulatory Visit (HOSPITAL_BASED_OUTPATIENT_CLINIC_OR_DEPARTMENT_OTHER): Payer: Medicare Other

## 2012-03-03 ENCOUNTER — Other Ambulatory Visit (HOSPITAL_BASED_OUTPATIENT_CLINIC_OR_DEPARTMENT_OTHER): Payer: Medicare Other

## 2012-03-03 VITALS — BP 125/71 | HR 65 | Temp 99.1°F

## 2012-03-03 DIAGNOSIS — C20 Malignant neoplasm of rectum: Secondary | ICD-10-CM

## 2012-03-03 DIAGNOSIS — C801 Malignant (primary) neoplasm, unspecified: Secondary | ICD-10-CM

## 2012-03-03 DIAGNOSIS — C7800 Secondary malignant neoplasm of unspecified lung: Secondary | ICD-10-CM

## 2012-03-03 DIAGNOSIS — Z5111 Encounter for antineoplastic chemotherapy: Secondary | ICD-10-CM

## 2012-03-03 LAB — CBC WITH DIFFERENTIAL/PLATELET
Basophils Absolute: 0 10*3/uL (ref 0.0–0.1)
Eosinophils Absolute: 0.1 10*3/uL (ref 0.0–0.5)
HCT: 37.8 % — ABNORMAL LOW (ref 38.4–49.9)
HGB: 12.8 g/dL — ABNORMAL LOW (ref 13.0–17.1)
LYMPH%: 17.4 % (ref 14.0–49.0)
MCV: 93.8 fL (ref 79.3–98.0)
MONO#: 0.4 10*3/uL (ref 0.1–0.9)
MONO%: 11.4 % (ref 0.0–14.0)
NEUT#: 2.3 10*3/uL (ref 1.5–6.5)
NEUT%: 68.5 % (ref 39.0–75.0)
Platelets: 128 10*3/uL — ABNORMAL LOW (ref 140–400)
WBC: 3.3 10*3/uL — ABNORMAL LOW (ref 4.0–10.3)
nRBC: 0 % (ref 0–0)

## 2012-03-03 LAB — COMPREHENSIVE METABOLIC PANEL (CC13)
ALT: 14 U/L (ref 0–55)
BUN: 13 mg/dL (ref 7.0–26.0)
CO2: 24 mEq/L (ref 22–29)
Calcium: 9.2 mg/dL (ref 8.4–10.4)
Chloride: 108 mEq/L — ABNORMAL HIGH (ref 98–107)
Creatinine: 1 mg/dL (ref 0.7–1.3)
Glucose: 98 mg/dl (ref 70–99)
Total Bilirubin: 0.5 mg/dL (ref 0.20–1.20)

## 2012-03-03 MED ORDER — DEXTROSE 5 % IV SOLN: Freq: Once | INTRAVENOUS | Status: DC

## 2012-03-03 MED ORDER — SODIUM CHLORIDE 0.9 % IV SOLN
INTRAVENOUS | Status: DC
  Administered 2012-03-03: 14:00:00 via INTRAVENOUS

## 2012-03-03 MED ORDER — SODIUM CHLORIDE 0.9 % IJ SOLN
10.0000 mL | INTRAMUSCULAR | Status: DC | PRN
  Filled 2012-03-03: qty 10

## 2012-03-03 MED ORDER — HEPARIN SOD (PORK) LOCK FLUSH 100 UNIT/ML IV SOLN
500.0000 [IU] | Freq: Once | INTRAVENOUS | Status: DC | PRN
  Filled 2012-03-03: qty 5

## 2012-03-03 MED ORDER — SODIUM CHLORIDE 0.9 % IV SOLN
2400.0000 mg/m2 | INTRAVENOUS | Status: DC
  Administered 2012-03-03: 6000 mg via INTRAVENOUS
  Filled 2012-03-03: qty 120

## 2012-03-03 MED ORDER — LEUCOVORIN CALCIUM INJECTION 350 MG
400.0000 mg/m2 | Freq: Once | INTRAVENOUS | Status: AC
  Administered 2012-03-03: 1004 mg via INTRAVENOUS
  Filled 2012-03-03: qty 50.2

## 2012-03-03 MED ORDER — FLUOROURACIL CHEMO INJECTION 2.5 GM/50ML
400.0000 mg/m2 | Freq: Once | INTRAVENOUS | Status: AC
  Administered 2012-03-03: 1000 mg via INTRAVENOUS
  Filled 2012-03-03: qty 20

## 2012-03-03 NOTE — Progress Notes (Signed)
Okay per Lonna Cobb, NP to treat today despite WBC 3.3-dhp, rn

## 2012-03-05 ENCOUNTER — Ambulatory Visit (HOSPITAL_BASED_OUTPATIENT_CLINIC_OR_DEPARTMENT_OTHER): Payer: Medicare Other

## 2012-03-05 VITALS — BP 126/72 | Temp 98.3°F | Resp 73

## 2012-03-05 DIAGNOSIS — C7800 Secondary malignant neoplasm of unspecified lung: Secondary | ICD-10-CM

## 2012-03-05 DIAGNOSIS — Z452 Encounter for adjustment and management of vascular access device: Secondary | ICD-10-CM

## 2012-03-05 DIAGNOSIS — C20 Malignant neoplasm of rectum: Secondary | ICD-10-CM

## 2012-03-05 MED ORDER — SODIUM CHLORIDE 0.9 % IJ SOLN
10.0000 mL | INTRAMUSCULAR | Status: DC | PRN
  Administered 2012-03-05: 10 mL
  Filled 2012-03-05: qty 10

## 2012-03-05 MED ORDER — HEPARIN SOD (PORK) LOCK FLUSH 100 UNIT/ML IV SOLN
500.0000 [IU] | Freq: Once | INTRAVENOUS | Status: AC | PRN
  Administered 2012-03-05: 500 [IU]
  Filled 2012-03-05: qty 5

## 2012-03-16 ENCOUNTER — Other Ambulatory Visit: Payer: Self-pay | Admitting: Oncology

## 2012-03-17 ENCOUNTER — Other Ambulatory Visit: Payer: Self-pay | Admitting: *Deleted

## 2012-03-17 ENCOUNTER — Other Ambulatory Visit (HOSPITAL_BASED_OUTPATIENT_CLINIC_OR_DEPARTMENT_OTHER): Payer: Medicare Other | Admitting: Lab

## 2012-03-17 ENCOUNTER — Telehealth: Payer: Self-pay | Admitting: Oncology

## 2012-03-17 ENCOUNTER — Ambulatory Visit (HOSPITAL_BASED_OUTPATIENT_CLINIC_OR_DEPARTMENT_OTHER): Payer: Medicare Other

## 2012-03-17 ENCOUNTER — Ambulatory Visit (HOSPITAL_BASED_OUTPATIENT_CLINIC_OR_DEPARTMENT_OTHER): Payer: Medicare Other | Admitting: Oncology

## 2012-03-17 VITALS — BP 128/73 | HR 76 | Temp 97.9°F | Resp 18 | Ht 74.5 in | Wt 277.9 lb

## 2012-03-17 DIAGNOSIS — C7800 Secondary malignant neoplasm of unspecified lung: Secondary | ICD-10-CM

## 2012-03-17 DIAGNOSIS — C801 Malignant (primary) neoplasm, unspecified: Secondary | ICD-10-CM

## 2012-03-17 DIAGNOSIS — Z5111 Encounter for antineoplastic chemotherapy: Secondary | ICD-10-CM

## 2012-03-17 DIAGNOSIS — C20 Malignant neoplasm of rectum: Secondary | ICD-10-CM

## 2012-03-17 DIAGNOSIS — G622 Polyneuropathy due to other toxic agents: Secondary | ICD-10-CM

## 2012-03-17 LAB — COMPREHENSIVE METABOLIC PANEL (CC13)
ALT: 13 U/L (ref 0–55)
AST: 20 U/L (ref 5–34)
Albumin: 4.1 g/dL (ref 3.5–5.0)
CO2: 26 mEq/L (ref 22–29)
Calcium: 9.6 mg/dL (ref 8.4–10.4)
Chloride: 107 mEq/L (ref 98–107)
Potassium: 3.9 mEq/L (ref 3.5–5.1)

## 2012-03-17 LAB — CBC WITH DIFFERENTIAL/PLATELET
BASO%: 0.6 % (ref 0.0–2.0)
Eosinophils Absolute: 0 10*3/uL (ref 0.0–0.5)
HCT: 36.2 % — ABNORMAL LOW (ref 38.4–49.9)
MCHC: 33.4 g/dL (ref 32.0–36.0)
MONO#: 0.5 10*3/uL (ref 0.1–0.9)
NEUT#: 2.2 10*3/uL (ref 1.5–6.5)
NEUT%: 67.8 % (ref 39.0–75.0)
WBC: 3.2 10*3/uL — ABNORMAL LOW (ref 4.0–10.3)
lymph#: 0.5 10*3/uL — ABNORMAL LOW (ref 0.9–3.3)
nRBC: 0 % (ref 0–0)

## 2012-03-17 MED ORDER — LIDOCAINE-PRILOCAINE 2.5-2.5 % EX CREA: TOPICAL_CREAM | CUTANEOUS | Status: DC | PRN

## 2012-03-17 MED ORDER — FLUOROURACIL CHEMO INJECTION 2.5 GM/50ML
400.0000 mg/m2 | Freq: Once | INTRAVENOUS | Status: AC
  Administered 2012-03-17: 1000 mg via INTRAVENOUS
  Filled 2012-03-17: qty 20

## 2012-03-17 MED ORDER — SODIUM CHLORIDE 0.9 % IV SOLN
2400.0000 mg/m2 | INTRAVENOUS | Status: DC
  Administered 2012-03-17: 6000 mg via INTRAVENOUS
  Filled 2012-03-17: qty 120

## 2012-03-17 MED ORDER — LEUCOVORIN CALCIUM INJECTION 350 MG
398.5000 mg/m2 | Freq: Once | INTRAMUSCULAR | Status: AC
  Administered 2012-03-17: 1000 mg via INTRAVENOUS
  Filled 2012-03-17: qty 50

## 2012-03-17 MED ORDER — DEXTROSE 5 % IV SOLN
Freq: Once | INTRAVENOUS | Status: DC
Start: 2012-03-17 — End: 2012-03-17

## 2012-03-17 MED ORDER — SODIUM CHLORIDE 0.9 % IV SOLN
INTRAVENOUS | Status: DC
  Administered 2012-03-17: 11:00:00 via INTRAVENOUS

## 2012-03-17 NOTE — Patient Instructions (Signed)
[facility] Discharge Instructions for Patients Receiving Chemotherapy  Today you received the following chemotherapy agents Leucovorin and 5FU  To help prevent nausea and vomiting after your treatment, we encourage you to take your nausea medication as prescribed.   If you develop nausea and vomiting that is not controlled by your nausea medication, call the clinic. If it is after clinic hours your family physician or the after hours number for the clinic or go to the Emergency Department.   BELOW ARE SYMPTOMS THAT SHOULD BE REPORTED IMMEDIATELY:  *FEVER GREATER THAN 100.5 F  *CHILLS WITH OR WITHOUT FEVER  NAUSEA AND VOMITING THAT IS NOT CONTROLLED WITH YOUR NAUSEA MEDICATION  *UNUSUAL SHORTNESS OF BREATH  *UNUSUAL BRUISING OR BLEEDING  TENDERNESS IN MOUTH AND THROAT WITH OR WITHOUT PRESENCE OF ULCERS  *URINARY PROBLEMS  *BOWEL PROBLEMS  UNUSUAL RASH Items with * indicate a potential emergency and should be followed up as soon as possible.  One of the nurses will contact you 24 hours after your treatment. Please let the nurse know about any problems that you may have experienced. Feel free to call the clinic you have any questions or concerns. The clinic phone number is [phone].   I have been informed and understand all the instructions given to me. I know to contact the clinic, my physician, or go to the Emergency Department if any problems should occur. I do not have any questions at this time, but understand that I may call the clinic during office hours   should I have any questions or need assistance in obtaining follow up care.    __________________________________________  _____________  __________ Signature of Patient or Authorized Representative            Date                   Time    __________________________________________ Nurse's Signature    

## 2012-03-17 NOTE — Progress Notes (Signed)
   [facility]    OFFICE PROGRESS NOTE   INTERVAL HISTORY:   She returns as scheduled. He completed another cycle of 5-fluorouracil on 03/03/2012. He tolerated the chemotherapy well. No mouth sores, nausea, or diarrhea. No rectal bleeding. No new complaint.  Objective:  Vital signs in last 24 hours:  Blood pressure 128/73, pulse 76, temperature 97.9 F (36.6 C), temperature source Oral, resp. rate 18, height 6' 2.5" (1.892 m), weight 277 lb 14.4 oz (126.055 kg).    HEENT: No thrush or ulcers Resp: Lungs clear bilaterally Cardio: Regular rate and rhythm GI: No hepatomegaly, nontender, no mass Vascular: Trace edema at the left greater than right lower leg  Portacath/PICC-without erythema  Lab Results:  Lab Results  Component Value Date   WBC 3.2* 03/17/2012   HGB 12.1* 03/17/2012   HCT 36.2* 03/17/2012   MCV 93.1 03/17/2012   PLT 105* 03/17/2012   ANC 2.2    Medications: I have reviewed the patient's current medications.  Assessment/Plan: 1.Rectal cancer, clinical uT3 uN0, status post concurrent chemotherapy/radiation with radiation completed March 11, 2010. He is status post laparoscopic-assisted low anterior resection with diverting loop ileostomy 05/30/2010 with final pathology showing no evidence of malignancy. He declined adjuvant chemotherapy.  2. History of sigmoid colon polyp at 20.0 cm status post biopsy confirming high-grade dysplasia.  3. History of mild anemia likely related to chemotherapy/radiation and rectal bleeding.  4. Mental disability.  5. History of bilateral leg edema status post negative Doppler of the left leg 12/19/2009.  6. Family history of multiple cancers.  7. Colonoscopy 07/14/2011 with finding of a distal rectal mass status post biopsy with pathology showing invasive colorectal adenocarcinoma, moderately differentiated. Biopsy of a polyp in the ascending colon showed a tubular adenoma.  8. CT abdomen and pelvis 06/13/2011  with interval development of multiple pulmonary nodules in both lung bases. Multiple bilateral lung nodules confirmed on a CT of the chest 08/06/2011. Chemotherapy initiated with FOLFOX on 08/20/2011. Restaging CT of the chest on 10/27/2011 after 5 cycles of FOLFOX showed a decrease in the size and number of pulmonary nodules. He completed cycle 10 FOLFOX on 01/14/2012. Restaging chest CT on 02/02/2012 showed stable to improved disease in the lungs and no new lung nodules. Treatment continued with 5-FU/leucovorin as per the FOLFOX regimen on a 2 week schedule (oxaliplatin deleted from the regimen beginning with cycle 11).  9. Status post Port-A-Cath placement 08/14/2011.  10. CEA 3.7 on 08/07/2011.  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  12. Neutropenia/thrombocytopenia 11/26/2011 secondary to chemotherapy; persistent neutropenia on 12/03/2011. Oxaliplatin was dose reduced beginning with cycle 8. Oxaliplatin deleted from the regimen beginning with cycle 11.    Disposition:  He appears stable. He remains asymptomatic from the metastatic rectal cancer. He continues to tolerate the 5-fluorouracil well. The plan is to continue 5-fluorouracil on a 2 week schedule. A restaging CT of the chest will be obtained at a three-month interval. He will return for chemotherapy in 2 weeks. Mr. Szekely is scheduled for an office visit in 4 weeks.   Thornton Papas, MD  03/17/2012  5:56 PM

## 2012-03-17 NOTE — Telephone Encounter (Signed)
Gave pt appt for October 2013 lab, chemo, d/c pump and MD

## 2012-03-19 ENCOUNTER — Ambulatory Visit (HOSPITAL_BASED_OUTPATIENT_CLINIC_OR_DEPARTMENT_OTHER): Payer: Medicare Other

## 2012-03-19 VITALS — BP 136/60 | HR 76 | Temp 97.9°F

## 2012-03-19 DIAGNOSIS — C7800 Secondary malignant neoplasm of unspecified lung: Secondary | ICD-10-CM

## 2012-03-19 DIAGNOSIS — C20 Malignant neoplasm of rectum: Secondary | ICD-10-CM

## 2012-03-19 MED ORDER — HEPARIN SOD (PORK) LOCK FLUSH 100 UNIT/ML IV SOLN
500.0000 [IU] | Freq: Once | INTRAVENOUS | Status: AC | PRN
  Administered 2012-03-19: 500 [IU]
  Filled 2012-03-19: qty 5

## 2012-03-19 MED ORDER — SODIUM CHLORIDE 0.9 % IJ SOLN
10.0000 mL | INTRAMUSCULAR | Status: DC | PRN
  Administered 2012-03-19: 10 mL
  Filled 2012-03-19: qty 10

## 2012-03-19 NOTE — Progress Notes (Signed)
1255-5FU had large amount of chemotherapy remaining in bag.  Pump was turned off upon pt's entrance to flush area.  Pump turned on and it shows 26 hours of therapy remaining.  Asked pt how long pump had been turned off, he stated that "it went off yesterday afternoon, so I turned it off".  Advised him if his pump alarms prior to the time he is to return to the infusion area for pump d/c to call the number on the side.  He verbalized understanding.  Per Dr. Truett Perna, okay to d/c pump today and waste remaining chemotherapy.  Pump d/c'd and remaining chemotherapy returned to pharmacy for proper disposal-dhp, rn

## 2012-03-19 NOTE — Patient Instructions (Signed)
Call MD with any questions 

## 2012-03-31 ENCOUNTER — Other Ambulatory Visit (HOSPITAL_BASED_OUTPATIENT_CLINIC_OR_DEPARTMENT_OTHER): Payer: Medicare Other

## 2012-03-31 ENCOUNTER — Ambulatory Visit (HOSPITAL_BASED_OUTPATIENT_CLINIC_OR_DEPARTMENT_OTHER): Payer: Medicare Other

## 2012-03-31 ENCOUNTER — Other Ambulatory Visit: Payer: Self-pay | Admitting: Oncology

## 2012-03-31 VITALS — BP 116/77 | HR 56 | Temp 97.4°F

## 2012-03-31 DIAGNOSIS — C20 Malignant neoplasm of rectum: Secondary | ICD-10-CM

## 2012-03-31 DIAGNOSIS — C7800 Secondary malignant neoplasm of unspecified lung: Secondary | ICD-10-CM

## 2012-03-31 DIAGNOSIS — Z5111 Encounter for antineoplastic chemotherapy: Secondary | ICD-10-CM

## 2012-03-31 LAB — CBC WITH DIFFERENTIAL/PLATELET
Basophils Absolute: 0 10*3/uL (ref 0.0–0.1)
Eosinophils Absolute: 0.1 10*3/uL (ref 0.0–0.5)
HGB: 12.5 g/dL — ABNORMAL LOW (ref 13.0–17.1)
LYMPH%: 15.5 % (ref 14.0–49.0)
MCV: 94.4 fL (ref 79.3–98.0)
MONO%: 10.8 % (ref 0.0–14.0)
NEUT#: 2.9 10*3/uL (ref 1.5–6.5)
Platelets: 142 10*3/uL (ref 140–400)

## 2012-03-31 MED ORDER — HEPARIN SOD (PORK) LOCK FLUSH 100 UNIT/ML IV SOLN
500.0000 [IU] | Freq: Once | INTRAVENOUS | Status: DC | PRN
  Filled 2012-03-31: qty 5

## 2012-03-31 MED ORDER — SODIUM CHLORIDE 0.9 % IV SOLN
INTRAVENOUS | Status: DC
  Administered 2012-03-31: 10:00:00 via INTRAVENOUS

## 2012-03-31 MED ORDER — LEUCOVORIN CALCIUM INJECTION 350 MG
398.5000 mg/m2 | Freq: Once | INTRAVENOUS | Status: AC
  Administered 2012-03-31: 1000 mg via INTRAVENOUS
  Filled 2012-03-31: qty 50

## 2012-03-31 MED ORDER — SODIUM CHLORIDE 0.9 % IJ SOLN
10.0000 mL | INTRAMUSCULAR | Status: DC | PRN
  Filled 2012-03-31: qty 10

## 2012-03-31 MED ORDER — FLUOROURACIL CHEMO INJECTION 2.5 GM/50ML
400.0000 mg/m2 | Freq: Once | INTRAVENOUS | Status: AC
  Administered 2012-03-31: 1000 mg via INTRAVENOUS
  Filled 2012-03-31: qty 20

## 2012-03-31 MED ORDER — SODIUM CHLORIDE 0.9 % IV SOLN
2400.0000 mg/m2 | INTRAVENOUS | Status: DC
  Administered 2012-03-31: 6000 mg via INTRAVENOUS
  Filled 2012-03-31: qty 120

## 2012-03-31 NOTE — Patient Instructions (Addendum)
[facility] Discharge Instructions for Patients Receiving Chemotherapy  Today you received the following chemotherapy agents leucovorin and 5FU.  To help prevent nausea and vomiting after your treatment, we encourage you to take your nausea medication as prescribed.   If you develop nausea and vomiting that is not controlled by your nausea medication, call the clinic. If it is after clinic hours your family physician or the after hours number for the clinic or go to the Emergency Department.   BELOW ARE SYMPTOMS THAT SHOULD BE REPORTED IMMEDIATELY:  *FEVER GREATER THAN 100.5 F  *CHILLS WITH OR WITHOUT FEVER  NAUSEA AND VOMITING THAT IS NOT CONTROLLED WITH YOUR NAUSEA MEDICATION  *UNUSUAL SHORTNESS OF BREATH  *UNUSUAL BRUISING OR BLEEDING  TENDERNESS IN MOUTH AND THROAT WITH OR WITHOUT PRESENCE OF ULCERS  *URINARY PROBLEMS  *BOWEL PROBLEMS  UNUSUAL RASH Items with * indicate a potential emergency and should be followed up as soon as possible.  One of the nurses will contact you 24 hours after your treatment. Please let the nurse know about any problems that you may have experienced. Feel free to call the clinic you have any questions or concerns. The clinic phone number is [phone].   I have been informed and understand all the instructions given to me. I know to contact the clinic, my physician, or go to the Emergency Department if any problems should occur. I do not have any questions at this time, but understand that I may call the clinic during office hours   should I have any questions or need assistance in obtaining follow up care.    __________________________________________  _____________  __________ Signature of Patient or Authorized Representative            Date                   Time    __________________________________________ Nurse's Signature

## 2012-04-02 ENCOUNTER — Ambulatory Visit (HOSPITAL_BASED_OUTPATIENT_CLINIC_OR_DEPARTMENT_OTHER): Payer: Medicare Other

## 2012-04-02 VITALS — BP 131/72 | HR 90 | Temp 97.9°F

## 2012-04-02 DIAGNOSIS — C20 Malignant neoplasm of rectum: Secondary | ICD-10-CM

## 2012-04-02 DIAGNOSIS — C7800 Secondary malignant neoplasm of unspecified lung: Secondary | ICD-10-CM

## 2012-04-02 MED ORDER — SODIUM CHLORIDE 0.9 % IJ SOLN
10.0000 mL | INTRAMUSCULAR | Status: DC | PRN
  Administered 2012-04-02: 10 mL
  Filled 2012-04-02: qty 10

## 2012-04-02 MED ORDER — HEPARIN SOD (PORK) LOCK FLUSH 100 UNIT/ML IV SOLN
500.0000 [IU] | Freq: Once | INTRAVENOUS | Status: AC | PRN
  Administered 2012-04-02: 500 [IU]
  Filled 2012-04-02: qty 5

## 2012-04-02 NOTE — Patient Instructions (Signed)
Call MD with any questions 

## 2012-04-11 ENCOUNTER — Other Ambulatory Visit: Payer: Self-pay | Admitting: Oncology

## 2012-04-14 ENCOUNTER — Other Ambulatory Visit (HOSPITAL_BASED_OUTPATIENT_CLINIC_OR_DEPARTMENT_OTHER): Payer: Medicare Other | Admitting: Lab

## 2012-04-14 ENCOUNTER — Ambulatory Visit (HOSPITAL_BASED_OUTPATIENT_CLINIC_OR_DEPARTMENT_OTHER): Payer: Medicare Other | Admitting: Nurse Practitioner

## 2012-04-14 ENCOUNTER — Ambulatory Visit (HOSPITAL_BASED_OUTPATIENT_CLINIC_OR_DEPARTMENT_OTHER): Payer: Medicare Other

## 2012-04-14 ENCOUNTER — Telehealth: Payer: Self-pay | Admitting: *Deleted

## 2012-04-14 ENCOUNTER — Telehealth: Payer: Self-pay | Admitting: Oncology

## 2012-04-14 VITALS — BP 136/82 | HR 61 | Temp 98.0°F | Resp 20 | Ht 74.5 in | Wt 283.0 lb

## 2012-04-14 DIAGNOSIS — C20 Malignant neoplasm of rectum: Secondary | ICD-10-CM

## 2012-04-14 DIAGNOSIS — C7800 Secondary malignant neoplasm of unspecified lung: Secondary | ICD-10-CM

## 2012-04-14 DIAGNOSIS — L089 Local infection of the skin and subcutaneous tissue, unspecified: Secondary | ICD-10-CM

## 2012-04-14 DIAGNOSIS — Z5111 Encounter for antineoplastic chemotherapy: Secondary | ICD-10-CM

## 2012-04-14 LAB — COMPREHENSIVE METABOLIC PANEL (CC13)
AST: 19 U/L (ref 5–34)
Alkaline Phosphatase: 47 U/L (ref 40–150)
BUN: 14 mg/dL (ref 7.0–26.0)
Creatinine: 0.9 mg/dL (ref 0.7–1.3)

## 2012-04-14 LAB — CBC WITH DIFFERENTIAL/PLATELET
Basophils Absolute: 0 10*3/uL (ref 0.0–0.1)
EOS%: 1.7 % (ref 0.0–7.0)
HCT: 37.9 % — ABNORMAL LOW (ref 38.4–49.9)
HGB: 12.7 g/dL — ABNORMAL LOW (ref 13.0–17.1)
MCH: 31.5 pg (ref 27.2–33.4)
MCHC: 33.5 g/dL (ref 32.0–36.0)
MCV: 94 fL (ref 79.3–98.0)
MONO%: 9.3 % (ref 0.0–14.0)
NEUT%: 66.7 % (ref 39.0–75.0)
RDW: 15.2 % — ABNORMAL HIGH (ref 11.0–14.6)

## 2012-04-14 MED ORDER — SODIUM CHLORIDE 0.9 % IV SOLN
Freq: Once | INTRAVENOUS | Status: AC
  Administered 2012-04-14: 12:00:00 via INTRAVENOUS

## 2012-04-14 MED ORDER — SODIUM CHLORIDE 0.9 % IV SOLN
2400.0000 mg/m2 | INTRAVENOUS | Status: DC
  Administered 2012-04-14: 6000 mg via INTRAVENOUS
  Filled 2012-04-14: qty 120

## 2012-04-14 MED ORDER — LEUCOVORIN CALCIUM INJECTION 350 MG
398.5000 mg/m2 | Freq: Once | INTRAVENOUS | Status: AC
  Administered 2012-04-14: 1000 mg via INTRAVENOUS
  Filled 2012-04-14: qty 50

## 2012-04-14 MED ORDER — CEPHALEXIN 500 MG PO CAPS: 500.0000 mg | ORAL_CAPSULE | Freq: Three times a day (TID) | ORAL | Status: DC

## 2012-04-14 MED ORDER — FLUOROURACIL CHEMO INJECTION 2.5 GM/50ML
400.0000 mg/m2 | Freq: Once | INTRAVENOUS | Status: AC
  Administered 2012-04-14: 1000 mg via INTRAVENOUS
  Filled 2012-04-14: qty 20

## 2012-04-14 MED ORDER — ONDANSETRON 8 MG/50ML IVPB (CHCC)
8.0000 mg | Freq: Once | INTRAVENOUS | Status: AC
  Administered 2012-04-14: 8 mg via INTRAVENOUS

## 2012-04-14 NOTE — Patient Instructions (Signed)
[facility] Discharge Instructions for Patients Receiving Chemotherapy  Today you received the following chemotherapy agents 5FU    If you develop nausea and vomiting that is not controlled by your nausea medication, call the clinic. If it is after clinic hours your family physician or the after hours number for the clinic or go to the Emergency Department.   BELOW ARE SYMPTOMS THAT SHOULD BE REPORTED IMMEDIATELY:  *FEVER GREATER THAN 100.5 F  *CHILLS WITH OR WITHOUT FEVER  NAUSEA AND VOMITING THAT IS NOT CONTROLLED WITH YOUR NAUSEA MEDICATION  *UNUSUAL SHORTNESS OF BREATH  *UNUSUAL BRUISING OR BLEEDING  TENDERNESS IN MOUTH AND THROAT WITH OR WITHOUT PRESENCE OF ULCERS  *URINARY PROBLEMS  *BOWEL PROBLEMS  UNUSUAL RASH Items with * indicate a potential emergency and should be followed up as soon as possible.  One of the nurses will contact you 24 hours after your treatment. Please let the nurse know about any problems that you may have experienced. Feel free to call the clinic you have any questions or concerns. The clinic phone number is [phone].   I have been informed and understand all the instructions given to me. I know to contact the clinic, my physician, or go to the Emergency Department if any problems should occur. I do not have any questions at this time, but understand that I may call the clinic during office hours   should I have any questions or need assistance in obtaining follow up care.    __________________________________________  _____________  __________ Signature of Patient or Authorized Representative            Date                   Time    __________________________________________ Nurse's Signature

## 2012-04-14 NOTE — Telephone Encounter (Signed)
Per staff message and POF I have scheduled appts.  JMW  

## 2012-04-14 NOTE — Telephone Encounter (Signed)
appts made and will print for pt after mw adds tx,email to mw   aom

## 2012-04-14 NOTE — Progress Notes (Signed)
OFFICE PROGRESS NOTE  Interval history:  Mr. Aime returns as scheduled. He continues every 2 week 5 fluorouracil. He was last treated on 03/31/2012. He had nausea with one episode of vomiting after the pump was placed. He denies mouth sores. No diarrhea. No numbness or tingling in his hands or feet.  He has recently noted increased swelling of the left foot. The top of the foot has been painful. He denies calf pain.   Objective: Blood pressure 136/82, pulse 61, temperature 98 F (36.7 C), temperature source Oral, resp. rate 20, height 6' 2.5" (1.892 m), weight 283 lb (128.368 kg).  Oropharynx is without thrush or ulceration. Lungs are clear. Regular cardiac rhythm. Port-A-Cath site is without erythema. Abdomen is soft and nontender. No hepatomegaly. Trace edema at the right lower leg. Approximate 1+ edema at the left lower leg. The dorsal/lateral aspect of the left foot is mildly erythematous and tender. Bilateral calves are soft and nontender.  Lab Results: Lab Results  Component Value Date   WBC 4.2 04/14/2012   HGB 12.7* 04/14/2012   HCT 37.9* 04/14/2012   MCV 94.0 04/14/2012   PLT 120* 04/14/2012    Chemistry:    Chemistry      Component Value Date/Time   NA 140 03/17/2012 0916   NA 142 02/04/2012 0941   NA 141 10/27/2011 0857   K 3.9 03/17/2012 0916   K 4.0 02/04/2012 0941   K 4.4 10/27/2011 0857   CL 107 03/17/2012 0916   CL 105 02/04/2012 0941   CL 98 10/27/2011 0857   CO2 26 03/17/2012 0916   CO2 28 02/04/2012 0941   CO2 29 10/27/2011 0857   BUN 13.0 03/17/2012 0916   BUN 14 02/04/2012 0941   BUN 17 10/27/2011 0857   CREATININE 0.9 03/17/2012 0916   CREATININE 0.91 02/04/2012 0941   CREATININE 1.1 10/27/2011 0857      Component Value Date/Time   CALCIUM 9.6 03/17/2012 0916   CALCIUM 8.9 02/04/2012 0941   CALCIUM 9.0 10/27/2011 0857   ALKPHOS 45 03/17/2012 0916   ALKPHOS 43 02/04/2012 0941   ALKPHOS 41 10/27/2011 0857   AST 20 03/17/2012 0916   AST 29 02/04/2012 0941   AST 31 10/27/2011  0857   ALT 13 03/17/2012 0916   ALT 29 02/04/2012 0941   BILITOT 0.60 03/17/2012 0916   BILITOT 0.4 02/04/2012 0941   BILITOT 0.70 10/27/2011 0857       Studies/Results: No results found.  Medications: I have reviewed the patient's current medications.  Assessment/Plan:  1.Rectal cancer, clinical uT3 uN0, status post concurrent chemotherapy/radiation with radiation completed March 11, 2010. He is status post laparoscopic-assisted low anterior resection with diverting loop ileostomy 05/30/2010 with final pathology showing no evidence of malignancy. He declined adjuvant chemotherapy.  2. History of sigmoid colon polyp at 20.0 cm status post biopsy confirming high-grade dysplasia.  3. History of mild anemia likely related to chemotherapy/radiation and rectal bleeding.  4. Mental disability.  5. History of bilateral leg edema status post negative Doppler of the left leg 12/19/2009.  6. Family history of multiple cancers.  7. Colonoscopy 07/14/2011 with finding of a distal rectal mass status post biopsy with pathology showing invasive colorectal adenocarcinoma, moderately differentiated. Biopsy of a polyp in the ascending colon showed a tubular adenoma.  8. CT abdomen and pelvis 06/13/2011 with interval development of multiple pulmonary nodules in both lung bases. Multiple bilateral lung nodules confirmed on a CT of the chest 08/06/2011. Chemotherapy initiated with FOLFOX  on 08/20/2011. Restaging CT of the chest on 10/27/2011 after 5 cycles of FOLFOX showed a decrease in the size and number of pulmonary nodules. He completed cycle 10 FOLFOX on 01/14/2012. Restaging chest CT on 02/02/2012 showed stable to improved disease in the lungs and no new lung nodules. Treatment continued with 5-FU/leucovorin as per the FOLFOX regimen on a 2 week schedule (oxaliplatin deleted from the regimen beginning with cycle 11).  9. Status post Port-A-Cath placement 08/14/2011.  10. CEA 3.7 on 08/07/2011.  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  12. Neutropenia/thrombocytopenia 11/26/2011 secondary to chemotherapy; persistent neutropenia on 12/03/2011. Oxaliplatin was dose reduced beginning with cycle 8. Oxaliplatin deleted from the regimen beginning with cycle 11. 13. Question early cellulitis dorsal aspect of the left foot. He will complete a 10 day course of Keflex. He understands to contact the office with increased pain, swelling or redness. Of note, he has chronic asymmetric edema of the lower legs. 14. Mild nausea at time of pump placement on 03/31/2012. He will receive Zofran 8 mg IV as a premedication with today's treatment.  Disposition-plan to continue 5-fluorouracil on a 2 week schedule. He is due for treatment today and again in 2 weeks. We are referring him for a restaging CT scan of the chest on 05/07/2012. He will return for a followup visit with Dr. Truett Perna on 05/12/2012. He will contact the office in the interim as outlined above or with any other problems.  Plan reviewed with Dr. Truett Perna.   Lonna Cobb ANP/GNP-BC

## 2012-04-16 ENCOUNTER — Ambulatory Visit (HOSPITAL_BASED_OUTPATIENT_CLINIC_OR_DEPARTMENT_OTHER): Payer: Medicare Other

## 2012-04-16 VITALS — BP 118/68 | HR 59 | Temp 97.8°F | Resp 20

## 2012-04-16 DIAGNOSIS — C7800 Secondary malignant neoplasm of unspecified lung: Secondary | ICD-10-CM

## 2012-04-16 DIAGNOSIS — C20 Malignant neoplasm of rectum: Secondary | ICD-10-CM

## 2012-04-16 MED ORDER — SODIUM CHLORIDE 0.9 % IJ SOLN
10.0000 mL | INTRAMUSCULAR | Status: DC | PRN
  Administered 2012-04-16: 10 mL
  Filled 2012-04-16: qty 10

## 2012-04-16 MED ORDER — HEPARIN SOD (PORK) LOCK FLUSH 100 UNIT/ML IV SOLN
500.0000 [IU] | Freq: Once | INTRAVENOUS | Status: AC | PRN
  Administered 2012-04-16: 500 [IU]
  Filled 2012-04-16: qty 5

## 2012-04-16 NOTE — Patient Instructions (Signed)
Call MD for problems 

## 2012-04-16 NOTE — Progress Notes (Signed)
Patient here at 1400 for pump dc. RN found pump in off position and 243.55mls or remaining in 5FU bag.  Findings reported to Kem Boroughs, RN for Dr. Truett Perna.  Dr. Darrold Span (oncall MD) notified of findings and decision to discontinue any further infusion.

## 2012-04-19 ENCOUNTER — Other Ambulatory Visit: Payer: Self-pay | Admitting: Certified Registered Nurse Anesthetist

## 2012-04-25 ENCOUNTER — Other Ambulatory Visit: Payer: Self-pay | Admitting: Oncology

## 2012-04-28 ENCOUNTER — Ambulatory Visit (HOSPITAL_BASED_OUTPATIENT_CLINIC_OR_DEPARTMENT_OTHER): Payer: Medicare Other

## 2012-04-28 ENCOUNTER — Other Ambulatory Visit (HOSPITAL_BASED_OUTPATIENT_CLINIC_OR_DEPARTMENT_OTHER): Payer: Medicare Other | Admitting: Lab

## 2012-04-28 DIAGNOSIS — C20 Malignant neoplasm of rectum: Secondary | ICD-10-CM

## 2012-04-28 DIAGNOSIS — C7800 Secondary malignant neoplasm of unspecified lung: Secondary | ICD-10-CM

## 2012-04-28 DIAGNOSIS — Z5111 Encounter for antineoplastic chemotherapy: Secondary | ICD-10-CM

## 2012-04-28 LAB — CBC WITH DIFFERENTIAL/PLATELET
BASO%: 1.5 % (ref 0.0–2.0)
Basophils Absolute: 0.1 10*3/uL (ref 0.0–0.1)
EOS%: 3.3 % (ref 0.0–7.0)
HCT: 37.2 % — ABNORMAL LOW (ref 38.4–49.9)
HGB: 12.5 g/dL — ABNORMAL LOW (ref 13.0–17.1)
LYMPH%: 17.8 % (ref 14.0–49.0)
MCH: 32.1 pg (ref 27.2–33.4)
MCHC: 33.6 g/dL (ref 32.0–36.0)
MCV: 95.6 fL (ref 79.3–98.0)
NEUT%: 66.6 % (ref 39.0–75.0)
Platelets: 139 10*3/uL — ABNORMAL LOW (ref 140–400)
lymph#: 0.7 10*3/uL — ABNORMAL LOW (ref 0.9–3.3)

## 2012-04-28 LAB — COMPREHENSIVE METABOLIC PANEL (CC13)
AST: 20 U/L (ref 5–34)
Albumin: 3.9 g/dL (ref 3.5–5.0)
Alkaline Phosphatase: 47 U/L (ref 40–150)
BUN: 16 mg/dL (ref 7.0–26.0)
Calcium: 9.7 mg/dL (ref 8.4–10.4)
Creatinine: 0.9 mg/dL (ref 0.7–1.3)
Glucose: 93 mg/dl (ref 70–99)
Potassium: 3.7 mEq/L (ref 3.5–5.1)

## 2012-04-28 MED ORDER — ONDANSETRON 8 MG/50ML IVPB (CHCC)
8.0000 mg | Freq: Once | INTRAVENOUS | Status: AC
  Administered 2012-04-28: 8 mg via INTRAVENOUS

## 2012-04-28 MED ORDER — SODIUM CHLORIDE 0.9 % IV SOLN
2400.0000 mg/m2 | INTRAVENOUS | Status: DC
  Administered 2012-04-28: 6000 mg via INTRAVENOUS
  Filled 2012-04-28: qty 120

## 2012-04-28 MED ORDER — SODIUM CHLORIDE 0.9 % IV SOLN
INTRAVENOUS | Status: DC
  Administered 2012-04-28: 11:00:00 via INTRAVENOUS

## 2012-04-28 MED ORDER — FLUOROURACIL CHEMO INJECTION 2.5 GM/50ML
400.0000 mg/m2 | Freq: Once | INTRAVENOUS | Status: AC
  Administered 2012-04-28: 1000 mg via INTRAVENOUS
  Filled 2012-04-28: qty 20

## 2012-04-28 MED ORDER — LEUCOVORIN CALCIUM INJECTION 350 MG
398.5000 mg/m2 | Freq: Once | INTRAMUSCULAR | Status: AC
  Administered 2012-04-28: 1000 mg via INTRAVENOUS
  Filled 2012-04-28: qty 50

## 2012-04-28 NOTE — Patient Instructions (Signed)
[facility] Discharge Instructions for Patients Receiving Chemotherapy  Today you received the following chemotherapy agents 5fu,leucovorin To help prevent nausea and vomiting after your treatment, we encourage you to take your nausea medication   Take it as often as prescribed.   If you develop nausea and vomiting that is not controlled by your nausea medication, call the clinic. If it is after clinic hours your family physician or the after hours number for the clinic or go to the Emergency Department.   BELOW ARE SYMPTOMS THAT SHOULD BE REPORTED IMMEDIATELY:  *FEVER GREATER THAN 100.5 F  *CHILLS WITH OR WITHOUT FEVER  NAUSEA AND VOMITING THAT IS NOT CONTROLLED WITH YOUR NAUSEA MEDICATION  *UNUSUAL SHORTNESS OF BREATH  *UNUSUAL BRUISING OR BLEEDING  TENDERNESS IN MOUTH AND THROAT WITH OR WITHOUT PRESENCE OF ULCERS  *URINARY PROBLEMS  *BOWEL PROBLEMS  UNUSUAL RASH Items with * indicate a potential emergency and should be followed up as soon as possible.  If this is your first treatment one of the nurses will contact you 24 hours after your treatment. Please let the nurse know about any problems that you may have experienced. Feel free to call the clinic you have any questions or concerns. The clinic phone number is [phone].   I have been informed and understand all the instructions given to me. I know to contact the clinic, my physician, or go to the Emergency Department if any problems should occur. I do not have any questions at this time, but understand that I may call the clinic during office hours   should I have any questions or need assistance in obtaining follow up care.    __________________________________________  _____________  __________ Signature of Patient or Authorized Representative            Date                   Time    __________________________________________ Nurse's Signature     

## 2012-04-30 ENCOUNTER — Ambulatory Visit (HOSPITAL_BASED_OUTPATIENT_CLINIC_OR_DEPARTMENT_OTHER): Payer: Medicare Other

## 2012-04-30 VITALS — BP 138/66 | HR 61 | Temp 97.8°F

## 2012-04-30 DIAGNOSIS — C20 Malignant neoplasm of rectum: Secondary | ICD-10-CM

## 2012-04-30 DIAGNOSIS — C7800 Secondary malignant neoplasm of unspecified lung: Secondary | ICD-10-CM

## 2012-04-30 MED ORDER — SODIUM CHLORIDE 0.9 % IJ SOLN
10.0000 mL | INTRAMUSCULAR | Status: DC | PRN
  Administered 2012-04-30: 10 mL
  Filled 2012-04-30: qty 10

## 2012-04-30 MED ORDER — HEPARIN SOD (PORK) LOCK FLUSH 100 UNIT/ML IV SOLN
500.0000 [IU] | Freq: Once | INTRAVENOUS | Status: AC | PRN
  Administered 2012-04-30: 500 [IU]
  Filled 2012-04-30: qty 5

## 2012-04-30 NOTE — Patient Instructions (Signed)
Call Md for problems

## 2012-05-07 ENCOUNTER — Ambulatory Visit (HOSPITAL_COMMUNITY)
Admission: RE | Admit: 2012-05-07 | Discharge: 2012-05-07 | Disposition: A | Discharge status: Home / Self Care | Payer: Medicare Other | Source: Ambulatory Visit | Attending: Nurse Practitioner | Admitting: Nurse Practitioner

## 2012-05-07 DIAGNOSIS — C20 Malignant neoplasm of rectum: Secondary | ICD-10-CM | POA: Insufficient documentation

## 2012-05-07 DIAGNOSIS — Z923 Personal history of irradiation: Secondary | ICD-10-CM | POA: Insufficient documentation

## 2012-05-07 DIAGNOSIS — C801 Malignant (primary) neoplasm, unspecified: Secondary | ICD-10-CM | POA: Insufficient documentation

## 2012-05-07 DIAGNOSIS — R911 Solitary pulmonary nodule: Secondary | ICD-10-CM | POA: Insufficient documentation

## 2012-05-07 MED ORDER — IOHEXOL 300 MG/ML  SOLN
80.0000 mL | Freq: Once | INTRAMUSCULAR | Status: AC | PRN
  Administered 2012-05-07: 80 mL via INTRAVENOUS

## 2012-05-11 ENCOUNTER — Other Ambulatory Visit: Payer: Self-pay | Admitting: Oncology

## 2012-05-12 ENCOUNTER — Other Ambulatory Visit: Payer: Self-pay | Admitting: *Deleted

## 2012-05-12 ENCOUNTER — Ambulatory Visit (HOSPITAL_BASED_OUTPATIENT_CLINIC_OR_DEPARTMENT_OTHER): Payer: Medicare Other

## 2012-05-12 ENCOUNTER — Telehealth: Payer: Self-pay | Admitting: *Deleted

## 2012-05-12 ENCOUNTER — Other Ambulatory Visit (HOSPITAL_BASED_OUTPATIENT_CLINIC_OR_DEPARTMENT_OTHER): Payer: Medicare Other | Admitting: Lab

## 2012-05-12 ENCOUNTER — Ambulatory Visit (HOSPITAL_BASED_OUTPATIENT_CLINIC_OR_DEPARTMENT_OTHER): Payer: Medicare Other | Admitting: Oncology

## 2012-05-12 VITALS — BP 141/84 | HR 67 | Temp 96.6°F | Resp 20 | Ht 74.5 in | Wt 280.8 lb

## 2012-05-12 DIAGNOSIS — C7800 Secondary malignant neoplasm of unspecified lung: Secondary | ICD-10-CM

## 2012-05-12 DIAGNOSIS — C20 Malignant neoplasm of rectum: Secondary | ICD-10-CM

## 2012-05-12 DIAGNOSIS — Z5111 Encounter for antineoplastic chemotherapy: Secondary | ICD-10-CM

## 2012-05-12 DIAGNOSIS — R110 Nausea: Secondary | ICD-10-CM

## 2012-05-12 DIAGNOSIS — D702 Other drug-induced agranulocytosis: Secondary | ICD-10-CM

## 2012-05-12 DIAGNOSIS — R911 Solitary pulmonary nodule: Secondary | ICD-10-CM

## 2012-05-12 LAB — COMPREHENSIVE METABOLIC PANEL (CC13)
AST: 20 U/L (ref 5–34)
Albumin: 4 g/dL (ref 3.5–5.0)
Alkaline Phosphatase: 47 U/L (ref 40–150)
BUN: 14 mg/dL (ref 7.0–26.0)
Potassium: 4 mEq/L (ref 3.5–5.1)
Sodium: 139 mEq/L (ref 136–145)
Total Protein: 6.8 g/dL (ref 6.4–8.3)

## 2012-05-12 LAB — CBC WITH DIFFERENTIAL/PLATELET
BASO%: 0.9 % (ref 0.0–2.0)
EOS%: 4.9 % (ref 0.0–7.0)
MCH: 31.8 pg (ref 27.2–33.4)
MCHC: 33.1 g/dL (ref 32.0–36.0)
RBC: 4 10*6/uL — ABNORMAL LOW (ref 4.20–5.82)
RDW: 14.9 % — ABNORMAL HIGH (ref 11.0–14.6)
lymph#: 0.7 10*3/uL — ABNORMAL LOW (ref 0.9–3.3)

## 2012-05-12 MED ORDER — LIDOCAINE-PRILOCAINE 2.5-2.5 % EX CREA: TOPICAL_CREAM | CUTANEOUS | Status: DC | PRN

## 2012-05-12 MED ORDER — ONDANSETRON 8 MG/50ML IVPB (CHCC)
8.0000 mg | Freq: Once | INTRAVENOUS | Status: AC
  Administered 2012-05-12: 8 mg via INTRAVENOUS

## 2012-05-12 MED ORDER — FLUOROURACIL CHEMO INJECTION 5 GM/100ML
2400.0000 mg/m2 | INTRAVENOUS | Status: DC
  Administered 2012-05-12: 6000 mg via INTRAVENOUS
  Filled 2012-05-12: qty 120

## 2012-05-12 MED ORDER — DEXTROSE 5 % IV SOLN
398.5000 mg/m2 | Freq: Once | INTRAVENOUS | Status: AC
  Administered 2012-05-12: 1000 mg via INTRAVENOUS
  Filled 2012-05-12: qty 50

## 2012-05-12 MED ORDER — FLUOROURACIL CHEMO INJECTION 2.5 GM/50ML
400.0000 mg/m2 | Freq: Once | INTRAVENOUS | Status: AC
  Administered 2012-05-12: 1000 mg via INTRAVENOUS
  Filled 2012-05-12: qty 20

## 2012-05-12 NOTE — Progress Notes (Signed)
[name]    OFFICE PROGRESS NOTE   INTERVAL HISTORY:   He returns as scheduled. He completed another cycle of 5-fluorouracil on 04/28/2012. He denies mouth sores, nausea, and diarrhea. No complaint.  The 5-FU pump has malfunctioned with the last 2 cycles of chemotherapy. [name] denies Brain with a pump. The pump has "Beeped"  Objective:  Vital signs in last 24 hours:  Blood pressure 141/84, pulse 67, temperature 96.6 F (35.9 C), temperature source Oral, resp. rate 20, height 6' 2.5" (1.892 m), weight 280 lb 12.8 oz (127.37 kg).    HEENT: No thrush or ulcers Lymphatics: No cervical, supraclavicular, axillary, or inguinal nodes Resp: Lungs clear bilaterally Cardio: Regular rate and rhythm GI: No hepatomegaly, nontender Vascular: Trace edema at the left greater than right lower leg   Portacath/PICC-without erythema  Lab Results:  Lab Results  Component Value Date   WBC 4.5 05/12/2012   HGB 12.7* 05/12/2012   HCT 38.4 05/12/2012   MCV 96.0 05/12/2012   PLT 128* 05/12/2012   ANC 3.1  X-rays: CT the chest on 05/07/2012-numerous subcentimeter pulmonary nodules , mostly unchanged. A 6 mm lesion in the right lower lobe has improved, a nodule in the left lower lobe measures 7 mm and is slightly larger. No other enlarging nodules.    Medications: I have reviewed the patient's current medications.  Assessment/Plan: 1.Rectal cancer, clinical uT3 uN0, status post concurrent chemotherapy/radiation with radiation completed March 11, 2010. He is status post laparoscopic-assisted low anterior resection with diverting loop ileostomy 05/30/2010 with final pathology showing no evidence of malignancy. He declined adjuvant chemotherapy.  2. History of sigmoid colon polyp at 20.0 cm status post biopsy confirming high-grade dysplasia.  3. History of mild anemia likely related to chemotherapy/radiation and rectal bleeding.  4. Mental disability.  5. History of  bilateral leg edema status post negative Doppler of the left leg 12/19/2009.  6. Family history of multiple cancers.  7. Colonoscopy 07/14/2011 with finding of a distal rectal mass status post biopsy with pathology showing invasive colorectal adenocarcinoma, moderately differentiated. Biopsy of a polyp in the ascending colon showed a tubular adenoma.  8. CT abdomen and pelvis 06/13/2011 with interval development of multiple pulmonary nodules in both lung bases. Multiple bilateral lung nodules confirmed on a CT of the chest 08/06/2011. Chemotherapy initiated with FOLFOX on 08/20/2011. Restaging CT of the chest on 10/27/2011 after 5 cycles of FOLFOX showed a decrease in the size and number of pulmonary nodules. He completed cycle 10 FOLFOX on 01/14/2012. Restaging chest CT on 02/02/2012 showed stable to improved disease in the lungs and no new lung nodules. Treatment continued with 5-FU/leucovorin as per the FOLFOX regimen on a 2 week schedule (oxaliplatin deleted from the regimen beginning with cycle 11). Restaging CT 05/07/2012 revealed stable lung nodules, no evidence for progressive metastatic disease. 9. Status post Port-A-Cath placement 08/14/2011.  10. CEA 3.7 on 08/07/2011.  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  12. Neutropenia/thrombocytopenia 11/26/2011 secondary to chemotherapy; persistent neutropenia on 12/03/2011. Oxaliplatin was dose reduced beginning with cycle 8. Oxaliplatin deleted from the regimen beginning with cycle 11.  13. Mild nausea with chemotherapy on 03/31/2012-Zofran was added beginning with chemotherapy on 04/14/2012  Disposition:  He appears stable. The restaging CT reveals no evidence for disease progression. He plans to continue infusional 5-fluorouracil on a 2 week schedule. He is scheduled for an office visit in 4 weeks.  [name] and his mother understand to contact the on-call  nursing service if the pump malfunctions.   [name],  [name]  05/12/2012  5:59 PM

## 2012-05-12 NOTE — Patient Instructions (Addendum)
[facility] Discharge Instructions for Patients Receiving Chemotherapy  Today you received the following chemotherapy agents;  Leucovorin and 5 FUl with a home pump with 5 FU.  To help prevent nausea and vomiting after your treatment, we encourage you to take your nausea medication;  Compazine (prochloraperzine).  If you develop nausea and vomiting that is not controlled by your nausea medication, call the clinic. If it is after clinic hours your family physician or the after hours number for the clinic or go to the Emergency Department.   BELOW ARE SYMPTOMS THAT SHOULD BE REPORTED IMMEDIATELY:  *FEVER GREATER THAN 100.5 F  *CHILLS WITH OR WITHOUT FEVER  NAUSEA AND VOMITING THAT IS NOT CONTROLLED WITH YOUR NAUSEA MEDICATION  *UNUSUAL SHORTNESS OF BREATH  *UNUSUAL BRUISING OR BLEEDING  TENDERNESS IN MOUTH AND THROAT WITH OR WITHOUT PRESENCE OF ULCERS  *URINARY PROBLEMS  *BOWEL PROBLEMS  UNUSUAL RASH Items with * indicate a potential emergency and should be followed up as soon as possible.  . Feel free to call the clinic you have any questions or concerns. The clinic phone number is [phone].   I have been informed and understand all the instructions given to me. I know to contact the clinic, my physician, or go to the Emergency Department if any problems should occur. I do not have any questions at this time, but understand that I may call the clinic during office hours   should I have any questions or need assistance in obtaining follow up care.    __________________________________________  _____________  __________ Signature of Patient or Authorized Representative            Date                   Time    __________________________________________ Nurse's Signature

## 2012-05-12 NOTE — Telephone Encounter (Signed)
Per staff message and POF I have scheduled appts.  JMW  

## 2012-05-13 ENCOUNTER — Telehealth: Payer: Self-pay | Admitting: Oncology

## 2012-05-13 NOTE — Telephone Encounter (Signed)
S/w pt mom re appt for 12/4. Pt to get new schedule when he comes in.

## 2012-05-13 NOTE — Telephone Encounter (Signed)
Talked to patient's wife, gave her appt for December and advised to get calendar

## 2012-05-14 ENCOUNTER — Ambulatory Visit (HOSPITAL_BASED_OUTPATIENT_CLINIC_OR_DEPARTMENT_OTHER): Payer: Medicare Other

## 2012-05-14 VITALS — BP 139/71 | HR 75 | Temp 98.7°F

## 2012-05-14 DIAGNOSIS — C20 Malignant neoplasm of rectum: Secondary | ICD-10-CM

## 2012-05-14 DIAGNOSIS — C7800 Secondary malignant neoplasm of unspecified lung: Secondary | ICD-10-CM

## 2012-05-14 MED ORDER — HEPARIN SOD (PORK) LOCK FLUSH 100 UNIT/ML IV SOLN
500.0000 [IU] | Freq: Once | INTRAVENOUS | Status: AC | PRN
  Administered 2012-05-14: 500 [IU]
  Filled 2012-05-14: qty 5

## 2012-05-14 MED ORDER — SODIUM CHLORIDE 0.9 % IJ SOLN
10.0000 mL | INTRAMUSCULAR | Status: DC | PRN
  Administered 2012-05-14: 10 mL
  Filled 2012-05-14: qty 10

## 2012-05-14 NOTE — Progress Notes (Signed)
Pt stated that pump started beeping infusion complete at 1215 05/14/12.  Pt arrived to pump d/c appt at 1300 for pump d/c.  SLJ

## 2012-05-26 ENCOUNTER — Other Ambulatory Visit (HOSPITAL_BASED_OUTPATIENT_CLINIC_OR_DEPARTMENT_OTHER): Payer: Medicare Other

## 2012-05-26 ENCOUNTER — Ambulatory Visit (HOSPITAL_BASED_OUTPATIENT_CLINIC_OR_DEPARTMENT_OTHER): Payer: Medicare Other

## 2012-05-26 ENCOUNTER — Other Ambulatory Visit: Payer: Self-pay | Admitting: Oncology

## 2012-05-26 VITALS — BP 128/75 | HR 63 | Temp 97.0°F | Resp 20

## 2012-05-26 DIAGNOSIS — Z5111 Encounter for antineoplastic chemotherapy: Secondary | ICD-10-CM

## 2012-05-26 DIAGNOSIS — C20 Malignant neoplasm of rectum: Secondary | ICD-10-CM

## 2012-05-26 DIAGNOSIS — C7800 Secondary malignant neoplasm of unspecified lung: Secondary | ICD-10-CM

## 2012-05-26 LAB — CBC WITH DIFFERENTIAL/PLATELET
Basophils Absolute: 0 10*3/uL (ref 0.0–0.1)
Eosinophils Absolute: 0.2 10*3/uL (ref 0.0–0.5)
HCT: 35.4 % — ABNORMAL LOW (ref 38.4–49.9)
HGB: 11.8 g/dL — ABNORMAL LOW (ref 13.0–17.1)
MCV: 95.4 fL (ref 79.3–98.0)
MONO%: 11.6 % (ref 0.0–14.0)
NEUT#: 2.5 10*3/uL (ref 1.5–6.5)
NEUT%: 66.8 % (ref 39.0–75.0)
Platelets: 119 10*3/uL — ABNORMAL LOW (ref 140–400)
RDW: 15.2 % — ABNORMAL HIGH (ref 11.0–14.6)

## 2012-05-26 MED ORDER — SODIUM CHLORIDE 0.9 % IV SOLN
2400.0000 mg/m2 | INTRAVENOUS | Status: DC
  Administered 2012-05-26: 6000 mg via INTRAVENOUS
  Filled 2012-05-26: qty 120

## 2012-05-26 MED ORDER — SODIUM CHLORIDE 0.9 % IV SOLN
INTRAVENOUS | Status: DC
  Administered 2012-05-26: 14:00:00 via INTRAVENOUS

## 2012-05-26 MED ORDER — FLUOROURACIL CHEMO INJECTION 2.5 GM/50ML
400.0000 mg/m2 | Freq: Once | INTRAVENOUS | Status: AC
  Administered 2012-05-26: 1000 mg via INTRAVENOUS
  Filled 2012-05-26: qty 20

## 2012-05-26 MED ORDER — LEUCOVORIN CALCIUM INJECTION 350 MG
398.5000 mg/m2 | Freq: Once | INTRAVENOUS | Status: AC
  Administered 2012-05-26: 1000 mg via INTRAVENOUS
  Filled 2012-05-26: qty 50

## 2012-05-26 MED ORDER — ONDANSETRON 8 MG/50ML IVPB (CHCC)
8.0000 mg | Freq: Once | INTRAVENOUS | Status: AC
  Administered 2012-05-26: 8 mg via INTRAVENOUS

## 2012-05-27 LAB — CEA: CEA: 1 ng/mL (ref 0.0–5.0)

## 2012-05-28 ENCOUNTER — Ambulatory Visit (HOSPITAL_BASED_OUTPATIENT_CLINIC_OR_DEPARTMENT_OTHER): Payer: Medicare Other

## 2012-05-28 VITALS — BP 124/93 | HR 67 | Temp 97.8°F

## 2012-05-28 DIAGNOSIS — C7800 Secondary malignant neoplasm of unspecified lung: Secondary | ICD-10-CM

## 2012-05-28 DIAGNOSIS — C20 Malignant neoplasm of rectum: Secondary | ICD-10-CM

## 2012-05-28 MED ORDER — SODIUM CHLORIDE 0.9 % IJ SOLN
10.0000 mL | INTRAMUSCULAR | Status: DC | PRN
  Administered 2012-05-28: 10 mL
  Filled 2012-05-28: qty 10

## 2012-05-28 MED ORDER — HEPARIN SOD (PORK) LOCK FLUSH 100 UNIT/ML IV SOLN
500.0000 [IU] | Freq: Once | INTRAVENOUS | Status: AC | PRN
  Administered 2012-05-28: 500 [IU]
  Filled 2012-05-28: qty 5

## 2012-05-28 NOTE — Patient Instructions (Signed)
Call MD for problems 

## 2012-06-06 ENCOUNTER — Other Ambulatory Visit: Payer: Self-pay | Admitting: Oncology

## 2012-06-09 ENCOUNTER — Telehealth: Payer: Self-pay | Admitting: Oncology

## 2012-06-09 ENCOUNTER — Ambulatory Visit (HOSPITAL_BASED_OUTPATIENT_CLINIC_OR_DEPARTMENT_OTHER): Payer: Medicare Other | Admitting: Nurse Practitioner

## 2012-06-09 ENCOUNTER — Ambulatory Visit (HOSPITAL_BASED_OUTPATIENT_CLINIC_OR_DEPARTMENT_OTHER): Payer: Medicare Other

## 2012-06-09 ENCOUNTER — Other Ambulatory Visit (HOSPITAL_BASED_OUTPATIENT_CLINIC_OR_DEPARTMENT_OTHER): Payer: Medicare Other

## 2012-06-09 VITALS — BP 128/71 | HR 57 | Temp 97.3°F | Resp 20 | Ht 74.5 in | Wt 278.0 lb

## 2012-06-09 DIAGNOSIS — Z5111 Encounter for antineoplastic chemotherapy: Secondary | ICD-10-CM

## 2012-06-09 DIAGNOSIS — C20 Malignant neoplasm of rectum: Secondary | ICD-10-CM

## 2012-06-09 DIAGNOSIS — C7800 Secondary malignant neoplasm of unspecified lung: Secondary | ICD-10-CM

## 2012-06-09 LAB — CBC WITH DIFFERENTIAL/PLATELET
BASO%: 0.7 % (ref 0.0–2.0)
Eosinophils Absolute: 0.1 10*3/uL (ref 0.0–0.5)
MCHC: 33.5 g/dL (ref 32.0–36.0)
MONO#: 0.4 10*3/uL (ref 0.1–0.9)
NEUT#: 1.9 10*3/uL (ref 1.5–6.5)
RBC: 3.96 10*6/uL — ABNORMAL LOW (ref 4.20–5.82)
RDW: 14.4 % (ref 11.0–14.6)
WBC: 2.9 10*3/uL — ABNORMAL LOW (ref 4.0–10.3)
nRBC: 0 % (ref 0–0)

## 2012-06-09 MED ORDER — ONDANSETRON 8 MG/50ML IVPB (CHCC)
8.0000 mg | Freq: Once | INTRAVENOUS | Status: AC
  Administered 2012-06-09: 8 mg via INTRAVENOUS

## 2012-06-09 MED ORDER — SODIUM CHLORIDE 0.9 % IV SOLN
2400.0000 mg/m2 | INTRAVENOUS | Status: DC
  Administered 2012-06-09: 6000 mg via INTRAVENOUS
  Filled 2012-06-09: qty 120

## 2012-06-09 MED ORDER — LEUCOVORIN CALCIUM INJECTION 350 MG
398.5000 mg/m2 | Freq: Once | INTRAVENOUS | Status: AC
  Administered 2012-06-09: 1000 mg via INTRAVENOUS
  Filled 2012-06-09: qty 50

## 2012-06-09 MED ORDER — FLUOROURACIL CHEMO INJECTION 2.5 GM/50ML
400.0000 mg/m2 | Freq: Once | INTRAVENOUS | Status: AC
  Administered 2012-06-09: 1000 mg via INTRAVENOUS
  Filled 2012-06-09: qty 20

## 2012-06-09 NOTE — Telephone Encounter (Signed)
Pt/mom given appt schedule for December/January while in infusion.

## 2012-06-09 NOTE — Progress Notes (Signed)
OFFICE PROGRESS NOTE  Interval history:  [name] returns as scheduled. He continues every 2 week 5 fluorouracil. He denies nausea/vomiting. No mouth sores. No diarrhea. No hand or foot pain or redness. He notes hyperpigmentation over the hands. He continues to have a good appetite.   Objective: Blood pressure 128/71, pulse 57, temperature 97.3 F (36.3 C), temperature source Oral, resp. rate 20, height 6' 2.5" (1.892 m), weight 278 lb (126.1 kg).  Oropharynx is without thrush or ulceration. Lungs are clear. Regular cardiac rhythm. Port-A-Cath site is without erythema. Abdomen is soft and nontender. No hepatomegaly. Trace lower leg edema bilaterally left greater than right. Hands with hyperpigmentation.  Lab Results: Lab Results  Component Value Date   WBC 2.9* 06/09/2012   HGB 12.6* 06/09/2012   HCT 37.6* 06/09/2012   MCV 94.9 06/09/2012   PLT 137* 06/09/2012    Chemistry:    Chemistry      Component Value Date/Time   NA 139 05/12/2012 0931   NA 142 02/04/2012 0941   NA 141 10/27/2011 0857   K 4.0 05/12/2012 0931   K 4.0 02/04/2012 0941   K 4.4 10/27/2011 0857   CL 107 05/12/2012 0931   CL 105 02/04/2012 0941   CL 98 10/27/2011 0857   CO2 27 05/12/2012 0931   CO2 28 02/04/2012 0941   CO2 29 10/27/2011 0857   BUN 14.0 05/12/2012 0931   BUN 14 02/04/2012 0941   BUN 17 10/27/2011 0857   CREATININE 0.9 05/12/2012 0931   CREATININE 0.91 02/04/2012 0941   CREATININE 1.1 10/27/2011 0857      Component Value Date/Time   CALCIUM 9.3 05/12/2012 0931   CALCIUM 8.9 02/04/2012 0941   CALCIUM 9.0 10/27/2011 0857   ALKPHOS 47 05/12/2012 0931   ALKPHOS 43 02/04/2012 0941   ALKPHOS 41 10/27/2011 0857   AST 20 05/12/2012 0931   AST 29 02/04/2012 0941   AST 31 10/27/2011 0857   ALT 16 05/12/2012 0931   ALT 29 02/04/2012 0941   BILITOT 0.47 05/12/2012 0931   BILITOT 0.4 02/04/2012 0941   BILITOT 0.70 10/27/2011 0857       Studies/Results: No results found.  Medications: I have reviewed the  patient's current medications.  Assessment/Plan:  1.Rectal cancer, clinical uT3 uN0, status post concurrent chemotherapy/radiation with radiation completed March 11, 2010. He is status post laparoscopic-assisted low anterior resection with diverting loop ileostomy 05/30/2010 with final pathology showing no evidence of malignancy. He declined adjuvant chemotherapy.  2. History of sigmoid colon polyp at 20.0 cm status post biopsy confirming high-grade dysplasia.  3. History of mild anemia likely related to chemotherapy/radiation and rectal bleeding.  4. Mental disability.  5. History of bilateral leg edema status post negative Doppler of the left leg 12/19/2009.  6. Family history of multiple cancers.  7. Colonoscopy 07/14/2011 with finding of a distal rectal mass status post biopsy with pathology showing invasive colorectal adenocarcinoma, moderately differentiated. Biopsy of a polyp in the ascending colon showed a tubular adenoma.  8. CT abdomen and pelvis 06/13/2011 with interval development of multiple pulmonary nodules in both lung bases. Multiple bilateral lung nodules confirmed on a CT of the chest 08/06/2011. Chemotherapy initiated with FOLFOX on 08/20/2011. Restaging CT of the chest on 10/27/2011 after 5 cycles of FOLFOX showed a decrease in the size and number of pulmonary nodules. He completed cycle 10 FOLFOX on 01/14/2012. Restaging chest CT on 02/02/2012 showed stable to improved disease in the lungs and no new lung nodules.  Treatment continued with 5-FU/leucovorin as per the FOLFOX regimen on a 2 week schedule (oxaliplatin deleted from the regimen beginning with cycle 11). Restaging CT 05/07/2012 revealed stable lung nodules, no evidence for progressive metastatic disease. Treatment continued with 5 fluorouracil as per the FOLFOX regimen on a 2 week schedule. 9. Status post Port-A-Cath placement 08/14/2011.  10. CEA 3.7 on 08/07/2011.  11. 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  12. Neutropenia/thrombocytopenia 11/26/2011 secondary to chemotherapy; persistent neutropenia on 12/03/2011. Oxaliplatin was dose reduced beginning with cycle 8. Oxaliplatin deleted from the regimen beginning with cycle 11.  13. Mild nausea with chemotherapy on 03/31/2012-Zofran was added beginning with chemotherapy on 04/14/2012. 14. Skin hyperpigmentation at the hands related to 5-fluorouracil.  Disposition-[name] appears stable. Plan to continue infusional 5-fluorouracil on a 2 week schedule. We will see him in followup in 4 weeks. He will contact the office in the interim with any problems.  Plan reviewed with Dr. Truett Perna.   Lonna Cobb ANP/GNP-BC

## 2012-06-09 NOTE — Patient Instructions (Addendum)
[facility] Discharge Instructions for Patients Receiving Chemotherapy  Today you received the following chemotherapy agents folfox  To help prevent nausea and vomiting after your treatment, we encourage you to take your nausea medication  and take it as often as prescribed.   If you develop nausea and vomiting that is not controlled by your nausea medication, call the clinic. If it is after clinic hours your family physician or the after hours number for the clinic or go to the Emergency Department.   BELOW ARE SYMPTOMS THAT SHOULD BE REPORTED IMMEDIATELY:  *FEVER GREATER THAN 100.5 F  *CHILLS WITH OR WITHOUT FEVER  NAUSEA AND VOMITING THAT IS NOT CONTROLLED WITH YOUR NAUSEA MEDICATION  *UNUSUAL SHORTNESS OF BREATH  *UNUSUAL BRUISING OR BLEEDING  TENDERNESS IN MOUTH AND THROAT WITH OR WITHOUT PRESENCE OF ULCERS  *URINARY PROBLEMS  *BOWEL PROBLEMS  UNUSUAL RASH Items with * indicate a potential emergency and should be followed up as soon as possible.  One of the nurses will contact you 24 hours after your treatment. Please let the nurse know about any problems that you may have experienced. Feel free to call the clinic you have any questions or concerns. The clinic phone number is [phone].   I have been informed and understand all the instructions given to me. I know to contact the clinic, my physician, or go to the Emergency Department if any problems should occur. I do not have any questions at this time, but understand that I may call the clinic during office hours   should I have any questions or need assistance in obtaining follow up care.    __________________________________________  _____________  __________ Signature of Patient or Authorized Representative            Date                   Time    __________________________________________ Nurse's Signature

## 2012-06-11 ENCOUNTER — Ambulatory Visit (HOSPITAL_BASED_OUTPATIENT_CLINIC_OR_DEPARTMENT_OTHER): Payer: Medicare Other

## 2012-06-11 VITALS — BP 155/79 | HR 66 | Temp 98.7°F | Resp 20

## 2012-06-11 DIAGNOSIS — C20 Malignant neoplasm of rectum: Secondary | ICD-10-CM

## 2012-06-11 DIAGNOSIS — C7800 Secondary malignant neoplasm of unspecified lung: Secondary | ICD-10-CM

## 2012-06-11 MED ORDER — HEPARIN SOD (PORK) LOCK FLUSH 100 UNIT/ML IV SOLN
500.0000 [IU] | Freq: Once | INTRAVENOUS | Status: AC | PRN
  Administered 2012-06-11: 500 [IU]
  Filled 2012-06-11: qty 5

## 2012-06-11 MED ORDER — SODIUM CHLORIDE 0.9 % IJ SOLN
10.0000 mL | INTRAMUSCULAR | Status: DC | PRN
  Administered 2012-06-11: 10 mL
  Filled 2012-06-11: qty 10

## 2012-06-20 ENCOUNTER — Other Ambulatory Visit: Payer: Self-pay | Admitting: Oncology

## 2012-06-21 ENCOUNTER — Encounter (INDEPENDENT_AMBULATORY_CARE_PROVIDER_SITE_OTHER): Payer: Self-pay | Admitting: General Surgery

## 2012-06-22 ENCOUNTER — Other Ambulatory Visit: Payer: Self-pay | Admitting: Medical Oncology

## 2012-06-22 DIAGNOSIS — C20 Malignant neoplasm of rectum: Secondary | ICD-10-CM

## 2012-06-24 ENCOUNTER — Ambulatory Visit (HOSPITAL_BASED_OUTPATIENT_CLINIC_OR_DEPARTMENT_OTHER): Payer: Medicare Other

## 2012-06-24 ENCOUNTER — Other Ambulatory Visit (HOSPITAL_BASED_OUTPATIENT_CLINIC_OR_DEPARTMENT_OTHER): Payer: Medicare Other | Admitting: Lab

## 2012-06-24 VITALS — BP 135/78 | HR 71 | Temp 97.5°F | Resp 20

## 2012-06-24 DIAGNOSIS — C7800 Secondary malignant neoplasm of unspecified lung: Secondary | ICD-10-CM

## 2012-06-24 DIAGNOSIS — C20 Malignant neoplasm of rectum: Secondary | ICD-10-CM

## 2012-06-24 DIAGNOSIS — Z5111 Encounter for antineoplastic chemotherapy: Secondary | ICD-10-CM

## 2012-06-24 LAB — CBC WITH DIFFERENTIAL/PLATELET
BASO%: 1.6 % (ref 0.0–2.0)
MCHC: 33.8 g/dL (ref 32.0–36.0)
MONO#: 0.7 10*3/uL (ref 0.1–0.9)
RBC: 4 10*6/uL — ABNORMAL LOW (ref 4.20–5.82)
RDW: 14.6 % (ref 11.0–14.6)
WBC: 4.4 10*3/uL (ref 4.0–10.3)
lymph#: 0.6 10*3/uL — ABNORMAL LOW (ref 0.9–3.3)
nRBC: 0 % (ref 0–0)

## 2012-06-24 LAB — COMPREHENSIVE METABOLIC PANEL (CC13)
ALT: 12 U/L (ref 0–55)
AST: 18 U/L (ref 5–34)
Albumin: 4 g/dL (ref 3.5–5.0)
Calcium: 9.2 mg/dL (ref 8.4–10.4)
Chloride: 107 mEq/L (ref 98–107)
Creatinine: 1.2 mg/dL (ref 0.7–1.3)
Potassium: 4.3 mEq/L (ref 3.5–5.1)

## 2012-06-24 MED ORDER — FLUOROURACIL CHEMO INJECTION 2.5 GM/50ML
400.0000 mg/m2 | Freq: Once | INTRAVENOUS | Status: AC
  Administered 2012-06-24: 1000 mg via INTRAVENOUS
  Filled 2012-06-24: qty 20

## 2012-06-24 MED ORDER — SODIUM CHLORIDE 0.9 % IV SOLN
2400.0000 mg/m2 | INTRAVENOUS | Status: DC
  Administered 2012-06-24: 6000 mg via INTRAVENOUS
  Filled 2012-06-24: qty 120

## 2012-06-24 MED ORDER — SODIUM CHLORIDE 0.9 % IV SOLN
INTRAVENOUS | Status: DC
  Administered 2012-06-24: 14:00:00 via INTRAVENOUS

## 2012-06-24 MED ORDER — ONDANSETRON 8 MG/50ML IVPB (CHCC)
8.0000 mg | Freq: Once | INTRAVENOUS | Status: AC
  Administered 2012-06-24: 8 mg via INTRAVENOUS

## 2012-06-24 MED ORDER — LEUCOVORIN CALCIUM INJECTION 350 MG
398.5000 mg/m2 | Freq: Once | INTRAVENOUS | Status: AC
  Administered 2012-06-24: 1000 mg via INTRAVENOUS
  Filled 2012-06-24: qty 50

## 2012-06-24 NOTE — Patient Instructions (Signed)
[facility] Discharge Instructions for Patients Receiving Chemotherapy  Today you received the following chemotherapy agents 5 fu and Leucovorin To help prevent nausea and vomiting after your treatment, we encourage you to take your nausea medication   Take it as often as prescribed.   If you develop nausea and vomiting that is not controlled by your nausea medication, call the clinic. If it is after clinic hours your family physician or the after hours number for the clinic or go to the Emergency Department.   BELOW ARE SYMPTOMS THAT SHOULD BE REPORTED IMMEDIATELY:  *FEVER GREATER THAN 100.5 F  *CHILLS WITH OR WITHOUT FEVER  NAUSEA AND VOMITING THAT IS NOT CONTROLLED WITH YOUR NAUSEA MEDICATION  *UNUSUAL SHORTNESS OF BREATH  *UNUSUAL BRUISING OR BLEEDING  TENDERNESS IN MOUTH AND THROAT WITH OR WITHOUT PRESENCE OF ULCERS  *URINARY PROBLEMS  *BOWEL PROBLEMS  UNUSUAL RASH Items with * indicate a potential emergency and should be followed up as soon as possible.  If this is your first treatment one of the nurses will contact you 24 hours after your treatment. Please let the nurse know about any problems that you may have experienced. Feel free to call the clinic you have any questions or concerns. The clinic phone number is [phone].   I have been informed and understand all the instructions given to me. I know to contact the clinic, my physician, or go to the Emergency Department if any problems should occur. I do not have any questions at this time, but understand that I may call the clinic during office hours   should I have any questions or need assistance in obtaining follow up care.    __________________________________________  _____________  __________ Signature of Patient or Authorized Representative            Date                   Time    __________________________________________ Nurse's Signature

## 2012-06-26 ENCOUNTER — Ambulatory Visit (HOSPITAL_BASED_OUTPATIENT_CLINIC_OR_DEPARTMENT_OTHER): Payer: Medicare Other

## 2012-06-26 VITALS — BP 143/79 | HR 64 | Temp 99.0°F | Resp 20

## 2012-06-26 DIAGNOSIS — C7800 Secondary malignant neoplasm of unspecified lung: Secondary | ICD-10-CM

## 2012-06-26 DIAGNOSIS — C20 Malignant neoplasm of rectum: Secondary | ICD-10-CM

## 2012-06-26 MED ORDER — HEPARIN SOD (PORK) LOCK FLUSH 100 UNIT/ML IV SOLN
500.0000 [IU] | Freq: Once | INTRAVENOUS | Status: AC | PRN
  Administered 2012-06-26: 500 [IU]
  Filled 2012-06-26: qty 5

## 2012-06-26 MED ORDER — SODIUM CHLORIDE 0.9 % IJ SOLN
10.0000 mL | INTRAMUSCULAR | Status: DC | PRN
  Administered 2012-06-26: 10 mL
  Filled 2012-06-26: qty 10

## 2012-07-05 ENCOUNTER — Other Ambulatory Visit: Payer: Self-pay | Admitting: Oncology

## 2012-07-07 ENCOUNTER — Ambulatory Visit (HOSPITAL_BASED_OUTPATIENT_CLINIC_OR_DEPARTMENT_OTHER): Payer: Medicare Other | Admitting: Oncology

## 2012-07-07 ENCOUNTER — Ambulatory Visit (HOSPITAL_BASED_OUTPATIENT_CLINIC_OR_DEPARTMENT_OTHER): Payer: Medicare Other

## 2012-07-07 ENCOUNTER — Other Ambulatory Visit (HOSPITAL_BASED_OUTPATIENT_CLINIC_OR_DEPARTMENT_OTHER): Payer: Medicare Other | Admitting: Lab

## 2012-07-07 VITALS — BP 147/60 | HR 68 | Temp 97.5°F | Resp 20 | Ht 74.5 in | Wt 283.5 lb

## 2012-07-07 DIAGNOSIS — D649 Anemia, unspecified: Secondary | ICD-10-CM

## 2012-07-07 DIAGNOSIS — Z5111 Encounter for antineoplastic chemotherapy: Secondary | ICD-10-CM

## 2012-07-07 DIAGNOSIS — C20 Malignant neoplasm of rectum: Secondary | ICD-10-CM

## 2012-07-07 DIAGNOSIS — R911 Solitary pulmonary nodule: Secondary | ICD-10-CM

## 2012-07-07 DIAGNOSIS — C7800 Secondary malignant neoplasm of unspecified lung: Secondary | ICD-10-CM

## 2012-07-07 DIAGNOSIS — D702 Other drug-induced agranulocytosis: Secondary | ICD-10-CM

## 2012-07-07 LAB — CBC WITH DIFFERENTIAL/PLATELET
BASO%: 1 % (ref 0.0–2.0)
EOS%: 1.6 % (ref 0.0–7.0)
HCT: 35.9 % — ABNORMAL LOW (ref 38.4–49.9)
LYMPH%: 15.3 % (ref 14.0–49.0)
MCH: 32.1 pg (ref 27.2–33.4)
MCHC: 33.7 g/dL (ref 32.0–36.0)
MONO%: 15.3 % — ABNORMAL HIGH (ref 0.0–14.0)
NEUT%: 66.8 % (ref 39.0–75.0)
Platelets: 124 10*3/uL — ABNORMAL LOW (ref 140–400)
RBC: 3.77 10*6/uL — ABNORMAL LOW (ref 4.20–5.82)
WBC: 3.1 10*3/uL — ABNORMAL LOW (ref 4.0–10.3)
nRBC: 0 % (ref 0–0)

## 2012-07-07 LAB — COMPREHENSIVE METABOLIC PANEL (CC13)
ALT: 7 U/L (ref 0–55)
AST: 16 U/L (ref 5–34)
Alkaline Phosphatase: 43 U/L (ref 40–150)
BUN: 10 mg/dL (ref 7.0–26.0)
Calcium: 9.1 mg/dL (ref 8.4–10.4)
Creatinine: 0.9 mg/dL (ref 0.7–1.3)
Total Bilirubin: 0.66 mg/dL (ref 0.20–1.20)

## 2012-07-07 MED ORDER — SODIUM CHLORIDE 0.9 % IJ SOLN
10.0000 mL | INTRAMUSCULAR | Status: DC | PRN
  Filled 2012-07-07: qty 10

## 2012-07-07 MED ORDER — SODIUM CHLORIDE 0.9 % IV SOLN
2400.0000 mg/m2 | INTRAVENOUS | Status: DC
  Administered 2012-07-07: 6000 mg via INTRAVENOUS
  Filled 2012-07-07: qty 120

## 2012-07-07 MED ORDER — SODIUM CHLORIDE 0.9 % IV SOLN
INTRAVENOUS | Status: DC
  Administered 2012-07-07: 10:00:00 via INTRAVENOUS

## 2012-07-07 MED ORDER — ONDANSETRON 8 MG/50ML IVPB (CHCC)
8.0000 mg | Freq: Once | INTRAVENOUS | Status: AC
  Administered 2012-07-07: 8 mg via INTRAVENOUS

## 2012-07-07 MED ORDER — HEPARIN SOD (PORK) LOCK FLUSH 100 UNIT/ML IV SOLN
500.0000 [IU] | Freq: Once | INTRAVENOUS | Status: DC | PRN
  Filled 2012-07-07: qty 5

## 2012-07-07 MED ORDER — LEUCOVORIN CALCIUM INJECTION 350 MG
398.5000 mg/m2 | Freq: Once | INTRAVENOUS | Status: AC
  Administered 2012-07-07: 1000 mg via INTRAVENOUS
  Filled 2012-07-07: qty 50

## 2012-07-07 MED ORDER — FLUOROURACIL CHEMO INJECTION 2.5 GM/50ML
400.0000 mg/m2 | Freq: Once | INTRAVENOUS | Status: AC
  Administered 2012-07-07: 1000 mg via INTRAVENOUS
  Filled 2012-07-07: qty 20

## 2012-07-07 NOTE — Progress Notes (Signed)
   [facility]    OFFICE PROGRESS NOTE   INTERVAL HISTORY:   He returns as scheduled. He continues every 2 week 5 fluorouracil. No mouth sores, nausea, diarrhea, or hand/foot pain. No new complaint  Objective:  Vital signs in last 24 hours:  Blood pressure 147/60, pulse 68, temperature 97.5 F (36.4 C), temperature source Oral, resp. rate 20, height 6' 2.5" (1.892 m), weight 283 lb 8 oz (128.595 kg).    HEENT: No thrush or ulcer, lipoma versus a sebaceous cyst at the posterior neck Resp: Lungs clear bilaterally, no respiratory distress Cardio: Regular rate and rhythm GI: No hepatomegaly, nontender Vascular: Chronic stasis change at the low leg bilaterally with 1+ edema at the left lower leg  Skin: Hyperpigmentation at the hands   Portacath/PICC-without erythema  Lab Results:  Lab Results  Component Value Date   WBC 3.1* 07/07/2012   HGB 12.1* 07/07/2012   HCT 35.9* 07/07/2012   MCV 95.2 07/07/2012   PLT 124* 07/07/2012   ANC 2.1 CEA on 05/26/2012-1.0  Medications: I have reviewed the patient's current medications.  Assessment/Plan: 1.Rectal cancer, clinical uT3 uN0, status post concurrent chemotherapy/radiation with radiation completed March 11, 2010. He is status post laparoscopic-assisted low anterior resection with diverting loop ileostomy 05/30/2010 with final pathology showing no evidence of malignancy. He declined adjuvant chemotherapy.  2. History of sigmoid colon polyp at 20.0 cm status post biopsy confirming high-grade dysplasia.  3. History of mild anemia likely related to chemotherapy/radiation and rectal bleeding.  4. Mental disability.  5. History of bilateral leg edema status post negative Doppler of the left leg 12/19/2009.  6. Family history of multiple cancers.  7. Colonoscopy 07/14/2011 with finding of a distal rectal mass status post biopsy with pathology showing invasive colorectal adenocarcinoma, moderately differentiated. Biopsy of  a polyp in the ascending colon showed a tubular adenoma.  8. CT abdomen and pelvis 06/13/2011 with interval development of multiple pulmonary nodules in both lung bases. Multiple bilateral lung nodules confirmed on a CT of the chest 08/06/2011. Chemotherapy initiated with FOLFOX on 08/20/2011. Restaging CT of the chest on 10/27/2011 after 5 cycles of FOLFOX showed a decrease in the size and number of pulmonary nodules. He completed cycle 10 FOLFOX on 01/14/2012. Restaging chest CT on 02/02/2012 showed stable to improved disease in the lungs and no new lung nodules. Treatment continued with 5-FU/leucovorin as per the FOLFOX regimen on a 2 week schedule (oxaliplatin deleted from the regimen beginning with cycle 11). Restaging CT 05/07/2012 revealed stable lung nodules, no evidence for progressive metastatic disease. Treatment continued with 5 fluorouracil as per the FOLFOX regimen on a 2 week schedule.  9. Status post Port-A-Cath placement 08/14/2011.  10. CEA 1.0 on 05/26/2012. 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  12. Neutropenia/thrombocytopenia 11/26/2011 secondary to chemotherapy; persistent neutropenia on 12/03/2011. Oxaliplatin was dose reduced beginning with cycle 8. Oxaliplatin deleted from the regimen beginning with cycle 11.  13. Mild nausea with chemotherapy on 03/31/2012-Zofran was added beginning with chemotherapy on 04/14/2012.  14. Skin hyperpigmentation at the hands related to 5-fluorouracil.   Disposition:  He appears stable. The plan is to continue every 2 week 5 fluorouracil. He will return for an office visit in 4 weeks.  The plan is to obtain a restaging CT evaluation at a 4 month interval.   Thornton Papas, MD  07/07/2012  9:49 AM

## 2012-07-07 NOTE — Patient Instructions (Addendum)
[facility] Discharge Instructions for Patients Receiving Chemotherapy  Today you received the following chemotherapy agents :  Loucovorin/5FU  To help prevent nausea and vomiting after your treatment, we encourage you to take your nausea medication.   If you develop nausea and vomiting that is not controlled by your nausea medication, call the clinic. If it is after clinic hours your family physician or the after hours number for the clinic or go to the Emergency Department.   BELOW ARE SYMPTOMS THAT SHOULD BE REPORTED IMMEDIATELY:  *FEVER GREATER THAN 100.5 F  *CHILLS WITH OR WITHOUT FEVER  NAUSEA AND VOMITING THAT IS NOT CONTROLLED WITH YOUR NAUSEA MEDICATION  *UNUSUAL SHORTNESS OF BREATH  *UNUSUAL BRUISING OR BLEEDING  TENDERNESS IN MOUTH AND THROAT WITH OR WITHOUT PRESENCE OF ULCERS  *URINARY PROBLEMS  *BOWEL PROBLEMS  UNUSUAL RASH Items with * indicate a potential emergency and should be followed up as soon as possible.  Please let the nurse know about any problems that you may have experienced. Feel free to call the clinic you have any questions or concerns. The clinic phone number is [phone].   I have been informed and understand all the instructions given to me. I know to contact the clinic, my physician, or go to the Emergency Department if any problems should occur. I do not have any questions at this time, but understand that I may call the clinic during office hours   should I have any questions or need assistance in obtaining follow up care.    __________________________________________  _____________  __________ Signature of Patient or Authorized Representative            Date                   Time    __________________________________________ Nurse's Signature

## 2012-07-08 ENCOUNTER — Other Ambulatory Visit: Payer: Self-pay | Admitting: Oncology

## 2012-07-09 ENCOUNTER — Ambulatory Visit (HOSPITAL_BASED_OUTPATIENT_CLINIC_OR_DEPARTMENT_OTHER): Payer: Medicare Other

## 2012-07-09 ENCOUNTER — Telehealth: Payer: Self-pay | Admitting: *Deleted

## 2012-07-09 ENCOUNTER — Telehealth: Payer: Self-pay | Admitting: Oncology

## 2012-07-09 VITALS — BP 134/74 | HR 80 | Temp 97.4°F

## 2012-07-09 DIAGNOSIS — C20 Malignant neoplasm of rectum: Secondary | ICD-10-CM

## 2012-07-09 DIAGNOSIS — C7800 Secondary malignant neoplasm of unspecified lung: Secondary | ICD-10-CM

## 2012-07-09 MED ORDER — SODIUM CHLORIDE 0.9 % IJ SOLN
10.0000 mL | INTRAMUSCULAR | Status: DC | PRN
  Administered 2012-07-09: 10 mL
  Filled 2012-07-09: qty 10

## 2012-07-09 MED ORDER — HEPARIN SOD (PORK) LOCK FLUSH 100 UNIT/ML IV SOLN
500.0000 [IU] | Freq: Once | INTRAVENOUS | Status: AC | PRN
  Administered 2012-07-09: 500 [IU]
  Filled 2012-07-09: qty 5

## 2012-07-09 NOTE — Telephone Encounter (Signed)
GV AND PRINTED APPT SCHEDUEL FOR PT FOR jAN AND FEB...EMAILED MICHELLE TO ADD TX

## 2012-07-09 NOTE — Telephone Encounter (Signed)
Per staff message and POF I have scheduled appts.  JMW  

## 2012-07-18 ENCOUNTER — Other Ambulatory Visit: Payer: Self-pay | Admitting: Oncology

## 2012-07-21 ENCOUNTER — Other Ambulatory Visit: Payer: Medicare Other | Admitting: Lab

## 2012-07-21 ENCOUNTER — Inpatient Hospital Stay: Payer: Medicare Other

## 2012-07-21 ENCOUNTER — Telehealth: Payer: Self-pay | Admitting: *Deleted

## 2012-07-21 NOTE — Telephone Encounter (Signed)
Per patient's mother they can't make it sue to weather. I have rescheduled appts to tomorrow. JMW

## 2012-07-22 ENCOUNTER — Other Ambulatory Visit (HOSPITAL_BASED_OUTPATIENT_CLINIC_OR_DEPARTMENT_OTHER): Payer: Medicare Other | Admitting: Lab

## 2012-07-22 ENCOUNTER — Ambulatory Visit (HOSPITAL_BASED_OUTPATIENT_CLINIC_OR_DEPARTMENT_OTHER): Payer: Medicare Other

## 2012-07-22 VITALS — BP 149/71 | HR 82 | Temp 97.5°F | Resp 20

## 2012-07-22 DIAGNOSIS — C20 Malignant neoplasm of rectum: Secondary | ICD-10-CM

## 2012-07-22 DIAGNOSIS — Z5111 Encounter for antineoplastic chemotherapy: Secondary | ICD-10-CM

## 2012-07-22 LAB — CBC WITH DIFFERENTIAL/PLATELET
Eosinophils Absolute: 0.1 10*3/uL (ref 0.0–0.5)
MCV: 96.1 fL (ref 79.3–98.0)
MONO%: 10.9 % (ref 0.0–14.0)
NEUT#: 2.6 10*3/uL (ref 1.5–6.5)
RBC: 3.88 10*6/uL — ABNORMAL LOW (ref 4.20–5.82)
RDW: 15 % — ABNORMAL HIGH (ref 11.0–14.6)
WBC: 3.9 10*3/uL — ABNORMAL LOW (ref 4.0–10.3)
nRBC: 0 % (ref 0–0)

## 2012-07-22 MED ORDER — SODIUM CHLORIDE 0.9 % IV SOLN
2400.0000 mg/m2 | INTRAVENOUS | Status: DC
  Administered 2012-07-22: 6000 mg via INTRAVENOUS
  Filled 2012-07-22: qty 120

## 2012-07-22 MED ORDER — FLUOROURACIL CHEMO INJECTION 2.5 GM/50ML
400.0000 mg/m2 | Freq: Once | INTRAVENOUS | Status: AC
  Administered 2012-07-22: 1000 mg via INTRAVENOUS
  Filled 2012-07-22: qty 20

## 2012-07-22 MED ORDER — DEXTROSE 5 % IV SOLN
Freq: Once | INTRAVENOUS | Status: AC
  Administered 2012-07-22: 13:00:00 via INTRAVENOUS

## 2012-07-22 MED ORDER — LEUCOVORIN CALCIUM INJECTION 350 MG
398.5000 mg/m2 | Freq: Once | INTRAVENOUS | Status: AC
  Administered 2012-07-22: 1000 mg via INTRAVENOUS
  Filled 2012-07-22: qty 50

## 2012-07-22 MED ORDER — ONDANSETRON 8 MG/50ML IVPB (CHCC)
8.0000 mg | Freq: Once | INTRAVENOUS | Status: AC
  Administered 2012-07-22: 8 mg via INTRAVENOUS

## 2012-07-22 NOTE — Patient Instructions (Addendum)
[facility] Discharge Instructions for Patients Receiving Chemotherapy  Today you received the following chemotherapy agents 5FU/LV  To help prevent nausea and vomiting after your treatment, we encourage you to take your nausea medication as directed.   If you develop nausea and vomiting that is not controlled by your nausea medication, call the clinic. If it is after clinic hours your family physician or the after hours number for the clinic or go to the Emergency Department.   BELOW ARE SYMPTOMS THAT SHOULD BE REPORTED IMMEDIATELY:  *FEVER GREATER THAN 100.5 F  *CHILLS WITH OR WITHOUT FEVER  NAUSEA AND VOMITING THAT IS NOT CONTROLLED WITH YOUR NAUSEA MEDICATION  *UNUSUAL SHORTNESS OF BREATH  *UNUSUAL BRUISING OR BLEEDING  TENDERNESS IN MOUTH AND THROAT WITH OR WITHOUT PRESENCE OF ULCERS  *URINARY PROBLEMS  *BOWEL PROBLEMS  UNUSUAL RASH Items with * indicate a potential emergency and should be followed up as soon as possible.  Feel free to call the clinic you have any questions or concerns. The clinic phone number is [phone].

## 2012-07-24 ENCOUNTER — Ambulatory Visit (HOSPITAL_BASED_OUTPATIENT_CLINIC_OR_DEPARTMENT_OTHER): Payer: Medicare Other

## 2012-07-24 VITALS — BP 135/60 | HR 78 | Temp 97.2°F

## 2012-07-24 DIAGNOSIS — C20 Malignant neoplasm of rectum: Secondary | ICD-10-CM

## 2012-07-24 DIAGNOSIS — C7800 Secondary malignant neoplasm of unspecified lung: Secondary | ICD-10-CM

## 2012-07-24 MED ORDER — HEPARIN SOD (PORK) LOCK FLUSH 100 UNIT/ML IV SOLN
500.0000 [IU] | Freq: Once | INTRAVENOUS | Status: AC | PRN
  Administered 2012-07-24: 500 [IU]
  Filled 2012-07-24: qty 5

## 2012-07-24 MED ORDER — SODIUM CHLORIDE 0.9 % IJ SOLN
10.0000 mL | INTRAMUSCULAR | Status: DC | PRN
  Administered 2012-07-24: 10 mL
  Filled 2012-07-24: qty 10

## 2012-07-28 ENCOUNTER — Telehealth: Payer: Self-pay | Admitting: Oncology

## 2012-07-28 ENCOUNTER — Telehealth: Payer: Self-pay | Admitting: *Deleted

## 2012-07-28 MED ORDER — HYDROCODONE-ACETAMINOPHEN 5-325 MG PO TABS: 1.0000 | ORAL_TABLET | Freq: Four times a day (QID) | ORAL | Status: AC | PRN

## 2012-07-28 NOTE — Telephone Encounter (Signed)
Message from pt's mother requesting pain med to be called in. Pt is having pain in his testes. Walking exacerbates pain. Bowels and bladder moving without difficulty. Reviewed with Misty Stanley, NP. Orders received. Instructed pt's mom to take pt to ED for severe pain not relieved with medication.

## 2012-07-28 NOTE — Telephone Encounter (Signed)
Medical Oncology on call  Mother called as she does not think pain medication called in to pharmacy as she expected. Electronic prescription documented in EMR today to [facility] for hydrocodone 5/325 # 30. I called pharmacy, which does not have this prescription. Same prescription given verbally now. Message left for RN as there may be some problem with electronic scripts to this pharmacy, so hopefully this can be investigated. Called mother back to let her know that script is at pharmacy now.  Ila Mcgill, MD

## 2012-08-02 ENCOUNTER — Telehealth: Payer: Self-pay | Admitting: *Deleted

## 2012-08-02 NOTE — Telephone Encounter (Signed)
Mother called to report "skin breaking open in places". When questioned in more detail, he only has one open area back of his hand that is about 1/2" in size. His thighs are very dry with red rough spot as well as his arms. Instructed her to but Neosporin on the hand open area and Udder Cream to all other areas. MD feels it is due to chemo. Will assess on 2/12.

## 2012-08-04 ENCOUNTER — Other Ambulatory Visit (HOSPITAL_BASED_OUTPATIENT_CLINIC_OR_DEPARTMENT_OTHER): Payer: Medicare Other | Admitting: Lab

## 2012-08-04 ENCOUNTER — Telehealth: Payer: Self-pay | Admitting: *Deleted

## 2012-08-04 ENCOUNTER — Inpatient Hospital Stay: Payer: Medicare Other

## 2012-08-04 ENCOUNTER — Ambulatory Visit (HOSPITAL_BASED_OUTPATIENT_CLINIC_OR_DEPARTMENT_OTHER): Payer: Medicare Other | Admitting: Nurse Practitioner

## 2012-08-04 VITALS — BP 123/76 | HR 72 | Temp 98.1°F | Resp 20 | Ht 74.5 in | Wt 290.2 lb

## 2012-08-04 DIAGNOSIS — C7800 Secondary malignant neoplasm of unspecified lung: Secondary | ICD-10-CM

## 2012-08-04 DIAGNOSIS — R21 Rash and other nonspecific skin eruption: Secondary | ICD-10-CM

## 2012-08-04 DIAGNOSIS — C20 Malignant neoplasm of rectum: Secondary | ICD-10-CM

## 2012-08-04 DIAGNOSIS — Z809 Family history of malignant neoplasm, unspecified: Secondary | ICD-10-CM

## 2012-08-04 LAB — CBC WITH DIFFERENTIAL/PLATELET
BASO%: 1 % (ref 0.0–2.0)
EOS%: 3.7 % (ref 0.0–7.0)
Eosinophils Absolute: 0.2 10*3/uL (ref 0.0–0.5)
LYMPH%: 13.2 % — ABNORMAL LOW (ref 14.0–49.0)
MCH: 31.8 pg (ref 27.2–33.4)
MCHC: 33.2 g/dL (ref 32.0–36.0)
MCV: 95.6 fL (ref 79.3–98.0)
MONO%: 16.4 % — ABNORMAL HIGH (ref 0.0–14.0)
Platelets: 141 10*3/uL (ref 140–400)
RBC: 3.62 10*6/uL — ABNORMAL LOW (ref 4.20–5.82)
RDW: 14.9 % — ABNORMAL HIGH (ref 11.0–14.6)
nRBC: 0 % (ref 0–0)

## 2012-08-04 NOTE — Progress Notes (Signed)
OFFICE PROGRESS NOTE  Interval history:  [name] returns as scheduled. He was last treated with 5-fluorouracil on 07/22/2012. He denies nausea/vomiting. No mouth sores. No diarrhea. He has a good appetite. He has recently noted some bleeding from the left nostril.   His mother contacted the office last week to report testicular pain. A prescription was called to his pharmacy for Vicodin. He is no longer having testicular pain. He has developed a rash over the legs. The rash is not pruritic. He notes burning when he applies lotion. His mother is concerned the rash is related to the chemotherapy. He is on no new medications. She feels he needs a break from treatment.   Objective: Blood pressure 123/76, pulse 72, temperature 98.1 F (36.7 C), temperature source Oral, resp. rate 20, height 6' 2.5" (1.892 m), weight 290 lb 3.2 oz (131.634 kg).  Oropharynx is without thrush or ulceration. Lungs are clear. Regular cardiac rhythm. Port-A-Cath site is without erythema. Abdomen is soft and nontender. No hepatomegaly. 1-2+ edema at the lower legs. Excoriated appearing rash at the medial thighs. More confluent type erythema at the lower legs bilaterally. Feet with mild desquamation, erythema. No skin breakdown.  Lab Results: Lab Results  Component Value Date   WBC 4.1 08/04/2012   HGB 11.5* 08/04/2012   HCT 34.6* 08/04/2012   MCV 95.6 08/04/2012   PLT 141 08/04/2012    Chemistry:    Chemistry      Component Value Date/Time   NA 141 07/07/2012 0828   NA 142 02/04/2012 0941   NA 141 10/27/2011 0857   K 3.9 07/07/2012 0828   K 4.0 02/04/2012 0941   K 4.4 10/27/2011 0857   CL 107 07/07/2012 0828   CL 105 02/04/2012 0941   CL 98 10/27/2011 0857   CO2 28 07/07/2012 0828   CO2 28 02/04/2012 0941   CO2 29 10/27/2011 0857   BUN 10.0 07/07/2012 0828   BUN 14 02/04/2012 0941   BUN 17 10/27/2011 0857   CREATININE 0.9 07/07/2012 0828   CREATININE 0.91 02/04/2012 0941   CREATININE 1.1 10/27/2011 0857      Component Value  Date/Time   CALCIUM 9.1 07/07/2012 0828   CALCIUM 8.9 02/04/2012 0941   CALCIUM 9.0 10/27/2011 0857   ALKPHOS 43 07/07/2012 0828   ALKPHOS 43 02/04/2012 0941   ALKPHOS 41 10/27/2011 0857   AST 16 07/07/2012 0828   AST 29 02/04/2012 0941   AST 31 10/27/2011 0857   ALT 7 07/07/2012 0828   ALT 29 02/04/2012 0941   BILITOT 0.66 07/07/2012 0828   BILITOT 0.4 02/04/2012 0941   BILITOT 0.70 10/27/2011 0857       Studies/Results: No results found.  Medications: I have reviewed the patient's current medications.  Assessment/Plan:  1.Rectal cancer, clinical uT3 uN0, status post concurrent chemotherapy/radiation with radiation completed March 11, 2010. He is status post laparoscopic-assisted low anterior resection with diverting loop ileostomy 05/30/2010 with final pathology showing no evidence of malignancy. He declined adjuvant chemotherapy.  2. History of sigmoid colon polyp at 20.0 cm status post biopsy confirming high-grade dysplasia.  3. History of mild anemia likely related to chemotherapy/radiation and rectal bleeding.  4. Mental disability.  5. History of bilateral leg edema status post negative Doppler of the left leg 12/19/2009.  6. Family history of multiple cancers.  7. Colonoscopy 07/14/2011 with finding of a distal rectal mass status post biopsy with pathology showing invasive colorectal adenocarcinoma, moderately differentiated. Biopsy of a polyp in the  ascending colon showed a tubular adenoma.  8. CT abdomen and pelvis 06/13/2011 with interval development of multiple pulmonary nodules in both lung bases. Multiple bilateral lung nodules confirmed on a CT of the chest 08/06/2011. Chemotherapy initiated with FOLFOX on 08/20/2011. Restaging CT of the chest on 10/27/2011 after 5 cycles of FOLFOX showed a decrease in the size and number of pulmonary nodules. He completed cycle 10 FOLFOX on 01/14/2012. Restaging chest CT on 02/02/2012 showed stable to improved disease in the lungs and no new lung  nodules. Treatment continued with 5-FU/leucovorin as per the FOLFOX regimen on a 2 week schedule (oxaliplatin deleted from the regimen beginning with cycle 11). Restaging CT 05/07/2012 revealed stable lung nodules, no evidence for progressive metastatic disease. Treatment continued with 5 fluorouracil as per the FOLFOX regimen on a 2 week schedule.  9. Status post Port-A-Cath placement 08/14/2011.  10. CEA 1.0 on 05/26/2012.  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  12. Neutropenia/thrombocytopenia 11/26/2011 secondary to chemotherapy; persistent neutropenia on 12/03/2011. Oxaliplatin was dose reduced beginning with cycle 8. Oxaliplatin deleted from the regimen beginning with cycle 11.  13. Mild nausea with chemotherapy on 03/31/2012-Zofran was added beginning with chemotherapy on 04/14/2012.  14. Skin hyperpigmentation at the hands related to 5-fluorouracil.  15. Skin rash over the legs. Question medication related, question related to 5-fluorouracil.  Disposition-[name] has a new skin rash over the legs. The etiology is unclear. The rash may be medication related, possibly to the 5-fluorouracil. We are holding today's treatment. He will return for a followup visit with reevaluation of the rash in 3 weeks. He will contact the office in the interim with any problems. We specifically discussed worsening of the rash.  Patient seen with Dr. Truett Perna.   Lonna Cobb ANP/GNP-BC

## 2012-08-04 NOTE — Telephone Encounter (Signed)
Per staff message and POF I have scheduled appts.  JMW  

## 2012-08-05 LAB — CEA: CEA: 1.4 ng/mL (ref 0.0–5.0)

## 2012-08-12 ENCOUNTER — Telehealth: Payer: Self-pay | Admitting: Oncology

## 2012-08-12 NOTE — Telephone Encounter (Signed)
Talked to pt's mother , gave her appt for lab, ML and chemo advised her to get calendar appt for March 201

## 2012-08-23 ENCOUNTER — Telehealth: Payer: Self-pay | Admitting: *Deleted

## 2012-08-23 NOTE — Telephone Encounter (Signed)
No transportation for his appointments on 08/25/12. She has arranged for transportation for next week (09/01/12) and requests his lab/OV/chemo be moved to then. Forwarded request to scheduler. Adds that his rash is gone and skin color in back to normal.

## 2012-08-24 ENCOUNTER — Telehealth: Payer: Self-pay | Admitting: *Deleted

## 2012-08-24 ENCOUNTER — Other Ambulatory Visit: Payer: Self-pay | Admitting: Oncology

## 2012-08-24 NOTE — Telephone Encounter (Signed)
Per desk RN I have rescheduled appts for tomorrow. Patient's mother called with new date/time.  JW

## 2012-08-25 ENCOUNTER — Ambulatory Visit: Payer: Medicare Other | Admitting: Nurse Practitioner

## 2012-08-25 ENCOUNTER — Inpatient Hospital Stay: Payer: Medicare Other

## 2012-08-25 ENCOUNTER — Other Ambulatory Visit: Payer: Medicare Other | Admitting: Lab

## 2012-08-29 ENCOUNTER — Other Ambulatory Visit: Payer: Self-pay | Admitting: Oncology

## 2012-09-01 ENCOUNTER — Telehealth: Payer: Self-pay | Admitting: Oncology

## 2012-09-01 ENCOUNTER — Ambulatory Visit (HOSPITAL_BASED_OUTPATIENT_CLINIC_OR_DEPARTMENT_OTHER): Payer: Medicare Other

## 2012-09-01 ENCOUNTER — Other Ambulatory Visit (HOSPITAL_BASED_OUTPATIENT_CLINIC_OR_DEPARTMENT_OTHER): Payer: Medicare Other | Admitting: Lab

## 2012-09-01 ENCOUNTER — Ambulatory Visit (HOSPITAL_BASED_OUTPATIENT_CLINIC_OR_DEPARTMENT_OTHER): Payer: Medicare Other | Admitting: Nurse Practitioner

## 2012-09-01 VITALS — BP 128/76 | HR 69 | Temp 97.3°F | Resp 20 | Ht 74.5 in | Wt 286.8 lb

## 2012-09-01 DIAGNOSIS — C7800 Secondary malignant neoplasm of unspecified lung: Secondary | ICD-10-CM

## 2012-09-01 DIAGNOSIS — C20 Malignant neoplasm of rectum: Secondary | ICD-10-CM

## 2012-09-01 DIAGNOSIS — R21 Rash and other nonspecific skin eruption: Secondary | ICD-10-CM

## 2012-09-01 DIAGNOSIS — D702 Other drug-induced agranulocytosis: Secondary | ICD-10-CM

## 2012-09-01 DIAGNOSIS — Z5111 Encounter for antineoplastic chemotherapy: Secondary | ICD-10-CM

## 2012-09-01 LAB — CBC WITH DIFFERENTIAL/PLATELET
BASO%: 0.5 % (ref 0.0–2.0)
Basophils Absolute: 0 10*3/uL (ref 0.0–0.1)
EOS%: 4.3 % (ref 0.0–7.0)
HGB: 13.2 g/dL (ref 13.0–17.1)
MCH: 31.8 pg (ref 27.2–33.4)
MCHC: 33.5 g/dL (ref 32.0–36.0)
MCV: 94.9 fL (ref 79.3–98.0)
MONO%: 9.3 % (ref 0.0–14.0)
RDW: 14.3 % (ref 11.0–14.6)

## 2012-09-01 MED ORDER — FLUOROURACIL CHEMO INJECTION 5 GM/100ML
1800.0000 mg/m2 | INTRAVENOUS | Status: DC
  Administered 2012-09-01: 4500 mg via INTRAVENOUS
  Filled 2012-09-01: qty 90

## 2012-09-01 MED ORDER — FLUOROURACIL CHEMO INJECTION 2.5 GM/50ML
300.0000 mg/m2 | Freq: Once | INTRAVENOUS | Status: AC
  Administered 2012-09-01: 750 mg via INTRAVENOUS
  Filled 2012-09-01: qty 15

## 2012-09-01 MED ORDER — LEUCOVORIN CALCIUM INJECTION 350 MG
299.0000 mg/m2 | Freq: Once | INTRAVENOUS | Status: AC
  Administered 2012-09-01: 750 mg via INTRAVENOUS
  Filled 2012-09-01: qty 37.5

## 2012-09-01 MED ORDER — ONDANSETRON 8 MG/50ML IVPB (CHCC)
8.0000 mg | Freq: Once | INTRAVENOUS | Status: AC
  Administered 2012-09-01: 8 mg via INTRAVENOUS

## 2012-09-01 NOTE — Progress Notes (Signed)
OFFICE PROGRESS NOTE  Interval history:  [name] returns as scheduled. He was last treated with 5-fluorouracil on 07/22/2012. Treatment was placed on hold beginning 08/04/2012 due to a new skin rash felt to possibly be related to 5-fluorouracil.  He reports the skin rash has resolved. No hand or foot pain or redness. No nausea or vomiting. No mouth sores. No diarrhea. He continues to have a good appetite. No more episodes of testicular pain.  Objective: Blood pressure 128/76, pulse 69, temperature 97.3 F (36.3 C), temperature source Oral, resp. rate 20, height 6' 2.5" (1.892 m), weight 286 lb 12.8 oz (130.092 kg).  Oropharynx is without thrush or ulceration. Lungs are clear. Regular cardiac rhythm. Port-A-Cath site is without erythema. Abdomen is soft and nontender. No hepatomegaly. 1-2+ edema at the lower legs bilaterally left greater than right. Chronic stasis changes at the lower legs bilaterally. Palms and soles are nontender and without erythema. Areas of hyperpigmentation at the medial thighs and lower legs at the sites of the previous excoriated appearing rash. Similar areas of hyperpigmentation at the forearms.  Lab Results: Lab Results  Component Value Date   WBC 4.2 09/01/2012   HGB 13.2 09/01/2012   HCT 39.4 09/01/2012   MCV 94.9 09/01/2012   PLT 116* 09/01/2012    Chemistry:    Chemistry      Component Value Date/Time   NA 141 07/07/2012 0828   NA 142 02/04/2012 0941   NA 141 10/27/2011 0857   K 3.9 07/07/2012 0828   K 4.0 02/04/2012 0941   K 4.4 10/27/2011 0857   CL 107 07/07/2012 0828   CL 105 02/04/2012 0941   CL 98 10/27/2011 0857   CO2 28 07/07/2012 0828   CO2 28 02/04/2012 0941   CO2 29 10/27/2011 0857   BUN 10.0 07/07/2012 0828   BUN 14 02/04/2012 0941   BUN 17 10/27/2011 0857   CREATININE 0.9 07/07/2012 0828   CREATININE 0.91 02/04/2012 0941   CREATININE 1.1 10/27/2011 0857      Component Value Date/Time   CALCIUM 9.1 07/07/2012 0828   CALCIUM 8.9 02/04/2012 0941   CALCIUM  9.0 10/27/2011 0857   ALKPHOS 43 07/07/2012 0828   ALKPHOS 43 02/04/2012 0941   ALKPHOS 41 10/27/2011 0857   AST 16 07/07/2012 0828   AST 29 02/04/2012 0941   AST 31 10/27/2011 0857   ALT 7 07/07/2012 0828   ALT 29 02/04/2012 0941   BILITOT 0.66 07/07/2012 0828   BILITOT 0.4 02/04/2012 0941   BILITOT 0.70 10/27/2011 0857       Studies/Results: No results found.  Medications: I have reviewed the patient's current medications.  Assessment/Plan:  1.Rectal cancer, clinical uT3 uN0, status post concurrent chemotherapy/radiation with radiation completed March 11, 2010. He is status post laparoscopic-assisted low anterior resection with diverting loop ileostomy 05/30/2010 with final pathology showing no evidence of malignancy. He declined adjuvant chemotherapy.  2. History of sigmoid colon polyp at 20.0 cm status post biopsy confirming high-grade dysplasia.  3. History of mild anemia likely related to chemotherapy/radiation and rectal bleeding.  4. Mental disability.  5. History of bilateral leg edema status post negative Doppler of the left leg 12/19/2009.  6. Family history of multiple cancers.  7. Colonoscopy 07/14/2011 with finding of a distal rectal mass status post biopsy with pathology showing invasive colorectal adenocarcinoma, moderately differentiated. Biopsy of a polyp in the ascending colon showed a tubular adenoma.  8. CT abdomen and pelvis 06/13/2011 with interval development of multiple  pulmonary nodules in both lung bases. Multiple bilateral lung nodules confirmed on a CT of the chest 08/06/2011. Chemotherapy initiated with FOLFOX on 08/20/2011. Restaging CT of the chest on 10/27/2011 after 5 cycles of FOLFOX showed a decrease in the size and number of pulmonary nodules. He completed cycle 10 FOLFOX on 01/14/2012. Restaging chest CT on 02/02/2012 showed stable to improved disease in the lungs and no new lung nodules. Treatment continued with 5-FU/leucovorin as per the FOLFOX regimen on a 2  week schedule (oxaliplatin deleted from the regimen beginning with cycle 11). Restaging CT 05/07/2012 revealed stable lung nodules, no evidence for progressive metastatic disease. Treatment continued with 5 fluorouracil as per the FOLFOX regimen on a 2 week schedule.  9. Status post Port-A-Cath placement 08/14/2011.  10. CEA 1.0 on 05/26/2012.  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  12. Neutropenia/thrombocytopenia 11/26/2011 secondary to chemotherapy; persistent neutropenia on 12/03/2011. Oxaliplatin was dose reduced beginning with cycle 8. Oxaliplatin deleted from the regimen beginning with cycle 11.  13. Mild nausea with chemotherapy on 03/31/2012-Zofran was added beginning with chemotherapy on 04/14/2012.  14. Skin hyperpigmentation at the hands related to 5-fluorouracil.  15. Skin rash over the legs 08/04/2012. Question medication related, question related to 5-fluorouracil. Improved. The skin now appears hyperpigmented where the rash was previously located.  Disposition-[name] appears stable. The skin rash is better. The rash may be related to 5-fluorouracil. Plan to resume treatment today. The 5-fluorouracil will be dose reduced. He is being referred for a restaging CT scan of the chest prior to the next treatment. He will return for a followup visit and treatment in 2 weeks. He will contact the office in the interim with any problems. We specifically discussed recurrence of the skin rash.   Lonna Cobb ANP/GNP-BC

## 2012-09-01 NOTE — Telephone Encounter (Signed)
gv and printed appt schedule for pt for march...printed tx paper for [name]...pt aware cs will call with d/t of ct

## 2012-09-01 NOTE — Patient Instructions (Addendum)
[facility] Discharge Instructions for Patients Receiving Chemotherapy  Today you received the following chemotherapy agents ; Leucovorin and Fluorouracil.  To help prevent nausea and vomiting after your treatment, we encourage you to take your nausea medication; Compazine (prochloraperzine) as directed.  If you develop nausea and vomiting that is not controlled by your nausea medication, call the clinic. If it is after clinic hours your family physician or the after hours number for the clinic or go to the Emergency Department.   BELOW ARE SYMPTOMS THAT SHOULD BE REPORTED IMMEDIATELY:  *FEVER GREATER THAN 100.5 F  *CHILLS WITH OR WITHOUT FEVER  NAUSEA AND VOMITING THAT IS NOT CONTROLLED WITH YOUR NAUSEA MEDICATION  *UNUSUAL SHORTNESS OF BREATH  *UNUSUAL BRUISING OR BLEEDING  TENDERNESS IN MOUTH AND THROAT WITH OR WITHOUT PRESENCE OF ULCERS  *URINARY PROBLEMS  *BOWEL PROBLEMS  UNUSUAL RASH Items with * indicate a potential emergency and should be followed up as soon as possible.  . Feel free to call the clinic you have any questions or concerns. The clinic phone number is [phone].   I have been informed and understand all the instructions given to me. I know to contact the clinic, my physician, or go to the Emergency Department if any problems should occur. I do not have any questions at this time, but understand that I may call the clinic during office hours   should I have any questions or need assistance in obtaining follow up care.    __________________________________________  _____________  __________ Signature of Patient or Authorized Representative            Date                   Time    __________________________________________ Nurse's Signature

## 2012-09-03 ENCOUNTER — Ambulatory Visit (HOSPITAL_BASED_OUTPATIENT_CLINIC_OR_DEPARTMENT_OTHER): Payer: Medicare Other

## 2012-09-03 DIAGNOSIS — C20 Malignant neoplasm of rectum: Secondary | ICD-10-CM

## 2012-09-03 DIAGNOSIS — Z452 Encounter for adjustment and management of vascular access device: Secondary | ICD-10-CM

## 2012-09-03 DIAGNOSIS — C7800 Secondary malignant neoplasm of unspecified lung: Secondary | ICD-10-CM

## 2012-09-03 MED ORDER — HEPARIN SOD (PORK) LOCK FLUSH 100 UNIT/ML IV SOLN
500.0000 [IU] | Freq: Once | INTRAVENOUS | Status: AC
  Administered 2012-09-03: 500 [IU] via INTRAVENOUS
  Filled 2012-09-03: qty 5

## 2012-09-03 MED ORDER — SODIUM CHLORIDE 0.9 % IJ SOLN
10.0000 mL | INTRAMUSCULAR | Status: DC | PRN
  Administered 2012-09-03: 10 mL via INTRAVENOUS
  Filled 2012-09-03: qty 10

## 2012-09-05 ENCOUNTER — Other Ambulatory Visit: Payer: Self-pay | Admitting: Oncology

## 2012-09-08 ENCOUNTER — Inpatient Hospital Stay: Payer: Medicare Other

## 2012-09-12 ENCOUNTER — Other Ambulatory Visit: Payer: Self-pay | Admitting: Oncology

## 2012-09-13 ENCOUNTER — Ambulatory Visit (HOSPITAL_COMMUNITY)
Admission: RE | Admit: 2012-09-13 | Discharge: 2012-09-13 | Disposition: A | Discharge status: Home / Self Care | Payer: Medicare Other | Source: Ambulatory Visit | Attending: Nurse Practitioner | Admitting: Nurse Practitioner

## 2012-09-13 DIAGNOSIS — R911 Solitary pulmonary nodule: Secondary | ICD-10-CM | POA: Insufficient documentation

## 2012-09-13 DIAGNOSIS — C20 Malignant neoplasm of rectum: Secondary | ICD-10-CM | POA: Insufficient documentation

## 2012-09-13 DIAGNOSIS — N62 Hypertrophy of breast: Secondary | ICD-10-CM | POA: Insufficient documentation

## 2012-09-13 MED ORDER — IOHEXOL 300 MG/ML  SOLN
100.0000 mL | Freq: Once | INTRAMUSCULAR | Status: AC | PRN
  Administered 2012-09-13: 100 mL via INTRAVENOUS

## 2012-09-15 ENCOUNTER — Ambulatory Visit: Payer: Medicare Other

## 2012-09-15 ENCOUNTER — Other Ambulatory Visit (HOSPITAL_BASED_OUTPATIENT_CLINIC_OR_DEPARTMENT_OTHER): Payer: Medicare Other | Admitting: Lab

## 2012-09-15 ENCOUNTER — Ambulatory Visit (HOSPITAL_BASED_OUTPATIENT_CLINIC_OR_DEPARTMENT_OTHER): Payer: Medicare Other | Admitting: Nurse Practitioner

## 2012-09-15 ENCOUNTER — Telehealth: Payer: Self-pay | Admitting: Oncology

## 2012-09-15 VITALS — BP 146/94 | HR 60 | Temp 97.5°F | Resp 18 | Ht 74.5 in | Wt 283.5 lb

## 2012-09-15 DIAGNOSIS — C20 Malignant neoplasm of rectum: Secondary | ICD-10-CM

## 2012-09-15 DIAGNOSIS — C7800 Secondary malignant neoplasm of unspecified lung: Secondary | ICD-10-CM

## 2012-09-15 DIAGNOSIS — Z809 Family history of malignant neoplasm, unspecified: Secondary | ICD-10-CM

## 2012-09-15 LAB — CBC WITH DIFFERENTIAL/PLATELET
BASO%: 0.5 % (ref 0.0–2.0)
EOS%: 2.1 % (ref 0.0–7.0)
MCH: 31.6 pg (ref 27.2–33.4)
MCHC: 33.9 g/dL (ref 32.0–36.0)
MCV: 93.4 fL (ref 79.3–98.0)
MONO%: 7.7 % (ref 0.0–14.0)
NEUT#: 2.7 10*3/uL (ref 1.5–6.5)
RBC: 4.08 10*6/uL — ABNORMAL LOW (ref 4.20–5.82)
RDW: 14.2 % (ref 11.0–14.6)
nRBC: 0 % (ref 0–0)

## 2012-09-15 NOTE — Progress Notes (Signed)
OFFICE PROGRESS NOTE  Interval history:  [name] returns as scheduled. He was last treated with 5-fluorouracil on 09/01/2012. The skin rash did not recur. He denies nausea/vomiting. No mouth sores. No diarrhea. He denies shortness of breath. No cough. He continues to have a good appetite.  Restaging chest CT 09/13/2012 showed progression of pulmonary metastatic disease since the chest CT of 05/07/2012. A left lower lobe pulmonary nodule had increased in size from 7 mm to 11 x 9 mm. There was interval enlargement of a peripheral right lower lobe nodule measuring 5 mm, previously 2 mm. There was a stable small nodule right lower lobe and several bilateral pulmonary nodules stable in size. There were no definite new pulmonary nodules identified.   Objective: Blood pressure 146/94, pulse 60, temperature 97.5 F (36.4 C), temperature source Oral, resp. rate 18, height 6' 2.5" (1.892 m), weight 283 lb 8 oz (128.595 kg).  No thrush or ulcerations. Lungs clear. Regular cardiac rhythm. Port-A-Cath site is without erythema. Abdomen is soft and nontender. Chronic stasis changes at the lower legs bilaterally. 1+ edema at the lower legs bilaterally left greater than right.  Lab Results: Lab Results  Component Value Date   WBC 3.8* 09/15/2012   HGB 12.9* 09/15/2012   HCT 38.1* 09/15/2012   MCV 93.4 09/15/2012   PLT 146 09/15/2012    Chemistry:    Chemistry      Component Value Date/Time   NA 141 07/07/2012 0828   NA 142 02/04/2012 0941   NA 141 10/27/2011 0857   K 3.9 07/07/2012 0828   K 4.0 02/04/2012 0941   K 4.4 10/27/2011 0857   CL 107 07/07/2012 0828   CL 105 02/04/2012 0941   CL 98 10/27/2011 0857   CO2 28 07/07/2012 0828   CO2 28 02/04/2012 0941   CO2 29 10/27/2011 0857   BUN 10.0 07/07/2012 0828   BUN 14 02/04/2012 0941   BUN 17 10/27/2011 0857   CREATININE 0.9 07/07/2012 0828   CREATININE 0.91 02/04/2012 0941   CREATININE 1.1 10/27/2011 0857      Component Value Date/Time   CALCIUM 9.1 07/07/2012 0828    CALCIUM 8.9 02/04/2012 0941   CALCIUM 9.0 10/27/2011 0857   ALKPHOS 43 07/07/2012 0828   ALKPHOS 43 02/04/2012 0941   ALKPHOS 41 10/27/2011 0857   AST 16 07/07/2012 0828   AST 29 02/04/2012 0941   AST 31 10/27/2011 0857   ALT 7 07/07/2012 0828   ALT 29 02/04/2012 0941   BILITOT 0.66 07/07/2012 0828   BILITOT 0.4 02/04/2012 0941   BILITOT 0.70 10/27/2011 0857       Studies/Results: Ct Chest W Contrast  09/13/2012  *RADIOLOGY REPORT*  Clinical Data: Restaging metastatic rectal cancer.  Diagnosed 2011. Radiation therapy complete.  Chemotherapy ongoing.  CT CHEST WITH CONTRAST  Technique:  Multidetector CT imaging of the chest was performed following the standard protocol during bolus administration of intravenous contrast.  Contrast: OMNIPAQUE IOHEXOL 300 MG/ML  SOLN  Comparison: Chest CT 05/07/2012  Findings: Normal heart size.  Thoracic aorta is normal in caliber. Negative for supraclavicular, mediastinal, hilar, or axillary lymphadenopathy.  There is prominent bilateral gynecomastia.  Negative for pleural or pericardial effusion.  A left lower lobe pulmonary nodule in the costophrenic angle has significantly increased in size.  It measures 11 x 9 mm on image number 47 of the lung windows (previously 7 mm greatest diameter on the chest CT of 05/07/2012). Interval enlargement of a peripheral right lower  lobe nodule, measuring 5 mm on image number 48 (previously 2 mm).  Stable small nodule right lower lobe on image number 49.  There are several bilateral pulmonary nodules that are stable in size.  No definite new pulmonary nodule is identified. There is no airspace disease.  No acute or suspicious bony abnormality is identified.  The imaged portion of the upper abdomen shows no acute abnormality or mass lesion.  IMPRESSION: 1.  Progression of pulmonary metastatic disease since the chest CT of 05/07/2012.  While the majority of the pulmonary nodules are stable in size, there has been definite enlargement of  some of the pulmonary nodules, most notably one in the left lower lobe. 2.  Negative for lymphadenopathy or pleural effusion.   Original Report Authenticated By: Britta Mccreedy, M.D.     Medications: I have reviewed the patient's current medications.  Assessment/Plan:  1.Rectal cancer, clinical uT3 uN0, status post concurrent chemotherapy/radiation with radiation completed March 11, 2010. He is status post laparoscopic-assisted low anterior resection with diverting loop ileostomy 05/30/2010 with final pathology showing no evidence of malignancy. He declined adjuvant chemotherapy.  2. History of sigmoid colon polyp at 20.0 cm status post biopsy confirming high-grade dysplasia.  3. History of mild anemia likely related to chemotherapy/radiation and rectal bleeding.  4. Mental disability.  5. History of bilateral leg edema status post negative Doppler of the left leg 12/19/2009.  6. Family history of multiple cancers.  7. Colonoscopy 07/14/2011 with finding of a distal rectal mass status post biopsy with pathology showing invasive colorectal adenocarcinoma, moderately differentiated. Biopsy of a polyp in the ascending colon showed a tubular adenoma.  8. CT abdomen and pelvis 06/13/2011 with interval development of multiple pulmonary nodules in both lung bases. Multiple bilateral lung nodules confirmed on a CT of the chest 08/06/2011. Chemotherapy initiated with FOLFOX on 08/20/2011. Restaging CT of the chest on 10/27/2011 after 5 cycles of FOLFOX showed a decrease in the size and number of pulmonary nodules. He completed cycle 10 FOLFOX on 01/14/2012. Restaging chest CT on 02/02/2012 showed stable to improved disease in the lungs and no new lung nodules. Treatment continued with 5-FU/leucovorin as per the FOLFOX regimen on a 2 week schedule (oxaliplatin deleted from the regimen beginning with cycle 11). Restaging CT 05/07/2012 revealed stable lung nodules, no evidence for progressive metastatic disease.  Treatment continued with 5 fluorouracil as per the FOLFOX regimen on a 2 week schedule. Restaging chest CT 09/13/2012 showed progression of pulmonary metastatic disease. 9. Status post Port-A-Cath placement 08/14/2011.  10. CEA 1.0 on 05/26/2012.  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  12. Neutropenia/thrombocytopenia 11/26/2011 secondary to chemotherapy; persistent neutropenia on 12/03/2011. Oxaliplatin was dose reduced beginning with cycle 8. Oxaliplatin deleted from the regimen beginning with cycle 11.  13. Mild nausea with chemotherapy on 03/31/2012-Zofran was added beginning with chemotherapy on 04/14/2012.  14. Skin hyperpigmentation at the hands related to 5-fluorouracil.  15. Skin rash over the legs 08/04/2012. Question medication related, question related to 5-fluorouracil. Resolved. The skin rash did not recur following 5-fluorouracil 09/01/2012.  Disposition-Mr. Boschert appears stable. The recent restaging chest CT shows minimal progression in the lungs. He appears asymptomatic. Dr. Truett Perna recommends discontinuation of the current treatment consisting of every 2 week 5-fluorouracil. We discussed treatment with Xeloda/Avastin. Dr. Truett Perna recommends a repeat sigmoidoscopy as well as a staging PET scan. Mr. Kyser will return with his family on 10/08/2012 to review the results and further discuss treatment options.  Patient seen with Dr.  Sherrill. CT scan images were reviewed.   Lonna Cobb ANP/GNP-BC

## 2012-09-21 ENCOUNTER — Ambulatory Visit (HOSPITAL_COMMUNITY)
Admission: RE | Admit: 2012-09-21 | Discharge: 2012-09-21 | Disposition: A | Discharge status: Home / Self Care | Payer: Medicare Other | Source: Ambulatory Visit | Attending: Nurse Practitioner | Admitting: Nurse Practitioner

## 2012-09-21 ENCOUNTER — Other Ambulatory Visit: Payer: Self-pay | Admitting: Nurse Practitioner

## 2012-09-21 DIAGNOSIS — R918 Other nonspecific abnormal finding of lung field: Secondary | ICD-10-CM | POA: Insufficient documentation

## 2012-09-21 DIAGNOSIS — E042 Nontoxic multinodular goiter: Secondary | ICD-10-CM | POA: Insufficient documentation

## 2012-09-21 DIAGNOSIS — C20 Malignant neoplasm of rectum: Secondary | ICD-10-CM | POA: Insufficient documentation

## 2012-09-21 DIAGNOSIS — C7800 Secondary malignant neoplasm of unspecified lung: Secondary | ICD-10-CM

## 2012-09-21 MED ORDER — FLUDEOXYGLUCOSE F - 18 (FDG) INJECTION
15.9000 | Freq: Once | INTRAVENOUS | Status: AC | PRN
  Administered 2012-09-21: 15.9 via INTRAVENOUS

## 2012-09-23 LAB — GLUCOSE, CAPILLARY: Glucose-Capillary: 77 mg/dL (ref 70–99)

## 2012-10-08 ENCOUNTER — Ambulatory Visit (HOSPITAL_BASED_OUTPATIENT_CLINIC_OR_DEPARTMENT_OTHER): Payer: Medicare Other | Admitting: Oncology

## 2012-10-08 ENCOUNTER — Telehealth: Payer: Self-pay | Admitting: Oncology

## 2012-10-08 VITALS — BP 119/71 | HR 69 | Temp 97.6°F | Resp 20 | Ht 74.5 in | Wt 280.8 lb

## 2012-10-08 DIAGNOSIS — R21 Rash and other nonspecific skin eruption: Secondary | ICD-10-CM

## 2012-10-08 DIAGNOSIS — C20 Malignant neoplasm of rectum: Secondary | ICD-10-CM

## 2012-10-08 DIAGNOSIS — C7800 Secondary malignant neoplasm of unspecified lung: Secondary | ICD-10-CM

## 2012-10-08 DIAGNOSIS — E041 Nontoxic single thyroid nodule: Secondary | ICD-10-CM

## 2012-10-08 NOTE — Telephone Encounter (Signed)
gv and printed appt sched and avs for pt...lvm with msg system to call me back to schedule appt.Marland Kitcheni will call office again Monday to schedule..the patient aware...Marland Kitchenper Judeth Cornfield ok to put pt on flush 5.2.14  b/c they may change

## 2012-10-08 NOTE — Progress Notes (Signed)
[facility]    OFFICE PROGRESS NOTE   INTERVAL HISTORY:   He returns as scheduled. He feels well. No complaint. Good appetite.  Objective:  Vital signs in last 24 hours:  Blood pressure 119/71, pulse 69, temperature 97.6 F (36.4 C), temperature source Oral, resp. rate 20, height 6' 2.5" (1.892 m), weight 280 lb 12.8 oz (127.37 kg).    HEENT: Neck without mass Lymphatics: No cervical, supra-clavicular, axillary, or inguinal nodes Resp: Lungs clear bilaterally Cardio: Regular rate and rhythm GI: No hepatosplenomegaly Vascular: Trace edema at the left lower leg    Portacath/PICC-without erythema  Lab Results:  Lab Results  Component Value Date   WBC 3.8* 09/15/2012   HGB 12.9* 09/15/2012   HCT 38.1* 09/15/2012   MCV 93.4 09/15/2012   PLT 146 09/15/2012   X-rays PET scan 09/21/2012-hypermetabolic right thyroid nodule, no hypermetabolic lung nodules, no abnormal hypermetabolic activity in the liver, adrenal glands, or abdominal/pelvic lymph nodes.   Medications: I have reviewed the patient's current medications.  Assessment/Plan: 1.Rectal cancer, clinical uT3 uN0, status post concurrent chemotherapy/radiation with radiation completed March 11, 2010. He is status post laparoscopic-assisted low anterior resection with diverting loop ileostomy 05/30/2010 with final pathology showing no evidence of malignancy. He declined adjuvant chemotherapy.  2. History of sigmoid colon polyp at 20.0 cm status post biopsy confirming high-grade dysplasia.  3. History of mild anemia likely related to chemotherapy/radiation and rectal bleeding.  4. Mental disability.  5. History of bilateral leg edema status post negative Doppler of the left leg 12/19/2009.  6. Family history of multiple cancers.  7. Colonoscopy 07/14/2011 with finding of a distal rectal mass status post biopsy with pathology showing invasive colorectal adenocarcinoma, moderately differentiated. Biopsy of a  polyp in the ascending colon showed a tubular adenoma.  8. CT abdomen and pelvis 06/13/2011 with interval development of multiple pulmonary nodules in both lung bases. Multiple bilateral lung nodules confirmed on a CT of the chest 08/06/2011. Chemotherapy initiated with FOLFOX on 08/20/2011. Restaging CT of the chest on 10/27/2011 after 5 cycles of FOLFOX showed a decrease in the size and number of pulmonary nodules. He completed cycle 10 FOLFOX on 01/14/2012. Restaging chest CT on 02/02/2012 showed stable to improved disease in the lungs and no new lung nodules. Treatment continued with 5-FU/leucovorin as per the FOLFOX regimen on a 2 week schedule (oxaliplatin deleted from the regimen beginning with cycle 11). Restaging CT 05/07/2012 revealed stable lung nodules, no evidence for progressive metastatic disease. Treatment continued with 5 fluorouracil as per the FOLFOX regimen on a 2 week schedule. Restaging chest CT 09/13/2012 showed progression of pulmonary metastatic disease. A PET scan 09/21/2012 with a hypermetabolic thyroid nodule and no other evidence of hypermetabolic metastatic disease 9. Status post Port-A-Cath placement 08/14/2011.  10. CEA 1.0 on 05/26/2012.  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  12. Neutropenia/thrombocytopenia 11/26/2011 secondary to chemotherapy; persistent neutropenia on 12/03/2011. Oxaliplatin was dose reduced beginning with cycle 8. Oxaliplatin deleted from the regimen beginning with cycle 11.  13. Mild nausea with chemotherapy on 03/31/2012-Zofran was added beginning with chemotherapy on 04/14/2012.  14. Skin hyperpigmentation at the hands related to 5-fluorouracil.  15. Skin rash over the legs 08/04/2012. Question medication related, question related to 5-fluorouracil. Resolved. The skin rash did not recur following 5-fluorouracil 09/01/2012.  16.thyroid nodule-hypermetabolic on the PET scan 09/21/2012, we will consider referring him for a  thyroid ultrasound    Disposition:  I discussed the restaging PET scan  and CT with [name], his mother, and sister. The family does not wish for him to receive additional systemic therapy and would like to have the Port-A-Cath removed in the near future. I explained the lung nodules likely represent metastatic rectal cancer with small nodule size below the PET scan sensitivity range.  Systemic treatment will remain on hold. We will refer him to Dr. Loreta Ave to reassess the rectal tumor burden. He will return for an office visit in 2 weeks. I will present his case at the GI tumor conference on 10/20/2012.   Thornton Papas, MD  10/08/2012  1:37 PM

## 2012-10-11 ENCOUNTER — Telehealth: Payer: Self-pay | Admitting: Oncology

## 2012-10-11 NOTE — Telephone Encounter (Signed)
Scheduled pt with Dr. Loreta Ave for 4.22.14 @ 1:30pm....left pt vm with all of this information and office #

## 2012-10-22 ENCOUNTER — Telehealth (INDEPENDENT_AMBULATORY_CARE_PROVIDER_SITE_OTHER): Payer: Self-pay | Admitting: General Surgery

## 2012-10-22 ENCOUNTER — Telehealth: Payer: Self-pay | Admitting: Oncology

## 2012-10-22 ENCOUNTER — Ambulatory Visit: Payer: Medicare Other

## 2012-10-22 ENCOUNTER — Ambulatory Visit (HOSPITAL_BASED_OUTPATIENT_CLINIC_OR_DEPARTMENT_OTHER): Payer: Medicare Other | Admitting: Nurse Practitioner

## 2012-10-22 VITALS — BP 121/78 | HR 56 | Temp 97.0°F

## 2012-10-22 VITALS — BP 122/77 | HR 66 | Temp 97.9°F | Resp 20 | Ht 74.5 in | Wt 284.5 lb

## 2012-10-22 DIAGNOSIS — C20 Malignant neoplasm of rectum: Secondary | ICD-10-CM

## 2012-10-22 DIAGNOSIS — Z809 Family history of malignant neoplasm, unspecified: Secondary | ICD-10-CM

## 2012-10-22 DIAGNOSIS — E041 Nontoxic single thyroid nodule: Secondary | ICD-10-CM

## 2012-10-22 DIAGNOSIS — C7800 Secondary malignant neoplasm of unspecified lung: Secondary | ICD-10-CM

## 2012-10-22 MED ORDER — SODIUM CHLORIDE 0.9 % IJ SOLN
10.0000 mL | INTRAMUSCULAR | Status: DC | PRN
  Administered 2012-10-22: 10 mL via INTRAVENOUS
  Filled 2012-10-22: qty 10

## 2012-10-22 MED ORDER — HEPARIN SOD (PORK) LOCK FLUSH 100 UNIT/ML IV SOLN
500.0000 [IU] | Freq: Once | INTRAVENOUS | Status: AC
  Administered 2012-10-22: 500 [IU] via INTRAVENOUS
  Filled 2012-10-22: qty 5

## 2012-10-22 NOTE — Telephone Encounter (Signed)
Appt made to discuss PAC removal. LMOM making patient and his mother aware of appt. They are to call if they need to r/s.

## 2012-10-22 NOTE — Progress Notes (Signed)
OFFICE PROGRESS NOTE  Interval history:  [name] returns as scheduled. He underwent a flexible sigmoidoscopy on 10/13/2012. Findings included diverticulosis in the descending colon; anastomosis at 20 cm; question [name] stain in the rectum.  He feels well. He has a good appetite. He denies pain. No nausea or vomiting. Bowels moving regularly   Objective: Blood pressure 122/77, pulse 66, temperature 97.9 F (36.6 C), temperature source Oral, resp. rate 20, height 6' 2.5" (1.892 m), weight 284 lb 8 oz (129.048 kg).  Lungs are clear. Regular cardiac rhythm. Port-A-Cath site is without erythema. Abdomen is soft and nontender. No organomegaly. Trace lower leg edema bilaterally left slightly greater than right.  Lab Results: Lab Results  Component Value Date   WBC 3.8* 09/15/2012   HGB 12.9* 09/15/2012   HCT 38.1* 09/15/2012   MCV 93.4 09/15/2012   PLT 146 09/15/2012    Chemistry:    Chemistry      Component Value Date/Time   NA 141 07/07/2012 0828   NA 142 02/04/2012 0941   NA 141 10/27/2011 0857   K 3.9 07/07/2012 0828   K 4.0 02/04/2012 0941   K 4.4 10/27/2011 0857   CL 107 07/07/2012 0828   CL 105 02/04/2012 0941   CL 98 10/27/2011 0857   CO2 28 07/07/2012 0828   CO2 28 02/04/2012 0941   CO2 29 10/27/2011 0857   BUN 10.0 07/07/2012 0828   BUN 14 02/04/2012 0941   BUN 17 10/27/2011 0857   CREATININE 0.9 07/07/2012 0828   CREATININE 0.91 02/04/2012 0941   CREATININE 1.1 10/27/2011 0857      Component Value Date/Time   CALCIUM 9.1 07/07/2012 0828   CALCIUM 8.9 02/04/2012 0941   CALCIUM 9.0 10/27/2011 0857   ALKPHOS 43 07/07/2012 0828   ALKPHOS 43 02/04/2012 0941   ALKPHOS 41 10/27/2011 0857   AST 16 07/07/2012 0828   AST 29 02/04/2012 0941   AST 31 10/27/2011 0857   ALT 7 07/07/2012 0828   ALT 29 02/04/2012 0941   BILITOT 0.66 07/07/2012 0828   BILITOT 0.4 02/04/2012 0941   BILITOT 0.70 10/27/2011 0857       Studies/Results: No results found.  Medications: I have reviewed the patient's current  medications.  Assessment/Plan:  1.Rectal cancer, clinical uT3 uN0, status post concurrent chemotherapy/radiation with radiation completed March 11, 2010. He is status post laparoscopic-assisted low anterior resection with diverting loop ileostomy 05/30/2010 with final pathology showing no evidence of malignancy. He declined adjuvant chemotherapy.  2. History of sigmoid colon polyp at 20.0 cm status post biopsy confirming high-grade dysplasia.  3. History of mild anemia likely related to chemotherapy/radiation and rectal bleeding.  4. Mental disability.  5. History of bilateral leg edema status post negative Doppler of the left leg 12/19/2009.  6. Family history of multiple cancers.  7. Colonoscopy 07/14/2011 with finding of a distal rectal mass status post biopsy with pathology showing invasive colorectal adenocarcinoma, moderately differentiated. Biopsy of a polyp in the ascending colon showed a tubular adenoma.  8. CT abdomen and pelvis 06/13/2011 with interval development of multiple pulmonary nodules in both lung bases. Multiple bilateral lung nodules confirmed on a CT of the chest 08/06/2011. Chemotherapy initiated with FOLFOX on 08/20/2011. Restaging CT of the chest on 10/27/2011 after 5 cycles of FOLFOX showed a decrease in the size and number of pulmonary nodules. He completed cycle 10 FOLFOX on 01/14/2012. Restaging chest CT on 02/02/2012 showed stable to improved disease in the lungs and no  new lung nodules. Treatment continued with 5-FU/leucovorin as per the FOLFOX regimen on a 2 week schedule (oxaliplatin deleted from the regimen beginning with cycle 11). Restaging CT 05/07/2012 revealed stable lung nodules, no evidence for progressive metastatic disease. Treatment continued with 5 fluorouracil as per the FOLFOX regimen on a 2 week schedule. Restaging chest CT 09/13/2012 showed progression of pulmonary metastatic disease. A PET scan 09/21/2012 with a hypermetabolic thyroid nodule and no  other evidence of hypermetabolic metastatic disease.  9. Status post Port-A-Cath placement 08/14/2011.  10. CEA 1.0 on 05/26/2012.  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  12. Neutropenia/thrombocytopenia 11/26/2011 secondary to chemotherapy; persistent neutropenia on 12/03/2011. Oxaliplatin was dose reduced beginning with cycle 8. Oxaliplatin deleted from the regimen beginning with cycle 11.  13. Mild nausea with chemotherapy on 03/31/2012-Zofran was added beginning with chemotherapy on 04/14/2012.  14. Skin hyperpigmentation at the hands related to 5-fluorouracil.  15. Skin rash over the legs 08/04/2012. Question medication related, question related to 5-fluorouracil. Resolved. The skin rash did not recur following 5-fluorouracil 09/01/2012.  16. Thyroid nodule-hypermetabolic on the PET scan 09/21/2012, we will consider referring him for a thyroid ultrasound.  17. Flexible sigmoidoscopy 10/13/2012 with findings of diverticulosis in the descending colon; anastomosis at 20 cm; question [name] stain in the rectum.  Disposition-[name] appears stable. He and his family have decided against further chemotherapy and would like to have the Port-A-Cath removed. Dr. Truett Perna recommended continuation of the chemotherapy. We also recommended leaving the Port-A-Cath in place should it be needed in the future citing the high likelihood that the cancer will progress at some point.   We informed them that his case was presented at the recent GI cancer conference and upon review of the PET scan an area of hypermetabolic activity was noted at the rectum possibly indicating active cancer.   He is agreeable to return for a followup visit in 3 months. He or his family will contact the office in the interim with any problems.  Patient was seen with Dr. Truett Perna.    Lonna Cobb ANP/GNP-BC

## 2012-10-22 NOTE — Patient Instructions (Signed)
Call MD for problems or concerns 

## 2012-10-22 NOTE — Telephone Encounter (Signed)
Message copied by Liliana Cline on Fri Oct 22, 2012 11:07 AM ------      Message from: Dory Peru A      Created: Fri Oct 22, 2012 10:35 AM       Hi Shanen Norris,      Dr. Truett Perna has given an order for Mr. Tkach to have his port removed. Please contact his mother Deaire Mcwhirter with an appointment. The information is listed in EPIC.             Thanks,       Dory Peru      [id number] ------

## 2012-10-22 NOTE — Telephone Encounter (Signed)
gv pt mother appt schedule for August. Sent message to [facility] @ [facility] requesting port removal appt. [facility] will contact pt/mom. Mom aware.

## 2012-10-25 ENCOUNTER — Ambulatory Visit: Payer: Medicare Other | Admitting: Nurse Practitioner

## 2012-10-28 NOTE — Telephone Encounter (Signed)
Mother called back and was given appt time.  Mother agreeable at this time with date and time.

## 2012-11-25 ENCOUNTER — Ambulatory Visit (INDEPENDENT_AMBULATORY_CARE_PROVIDER_SITE_OTHER): Payer: Medicare Other | Admitting: General Surgery

## 2012-11-25 ENCOUNTER — Telehealth: Payer: Self-pay | Admitting: Oncology

## 2012-11-25 ENCOUNTER — Encounter (INDEPENDENT_AMBULATORY_CARE_PROVIDER_SITE_OTHER): Payer: Self-pay | Admitting: General Surgery

## 2012-11-25 VITALS — BP 138/82 | HR 60 | Temp 97.1°F | Resp 14 | Ht 74.5 in | Wt 276.2 lb

## 2012-11-25 DIAGNOSIS — C7800 Secondary malignant neoplasm of unspecified lung: Secondary | ICD-10-CM

## 2012-11-25 DIAGNOSIS — C20 Malignant neoplasm of rectum: Secondary | ICD-10-CM

## 2012-11-25 NOTE — Progress Notes (Signed)
Subjective:     Patient ID: [name], male   DOB: Jun 29, 1960, 52 y.o.   MRN: [id number]  HPI 52 year old [place] American male comes in for followup. He specifically comes in today with request to have his Port-A-Cath removed. He denies any fever, chills, nausea, vomiting, diarrhea or constipation. Trey Paula has a history of U T3 N0 rectal cancer with metastasis to the lung in 2011. He underwent neoadjuvant chemotherapy with radiation followed by laparoscopic low anterior resection with a loop ileostomy in December 2011. He underwent ileostomy reversal in April 2012. Subsequently he was found to have bilateral pulmonary metastases and had a new rectal cancer 10 cm from his anal verge in January 2013 .he underwent FOLFOX therapy.he apparently underwent a flexible sigmoidoscopy in April 2014 which demonstrated no finding of a lesion.  Despite recommendations from his oncologist, his mother as well as the patient desires port removal. They have stated that they understand that he is at risk for recurrent disease then chemotherapy would be recommended. However the patient and his mother states that he will not have any additional chemotherapy  PMHx, PSHx, SOCHx, FAMHx, ALL reviewed   Review of Systems 10 point review of systems was performed all systems are negative except for what is mentioned in the history of present illness    Objective:   Physical Exam BP 138/82  Pulse 60  Temp(Src) 97.1 F (36.2 C) (Temporal)  Resp 14  Ht 6' 2.5" (1.892 m)  Wt 276 lb 3.2 oz (125.283 kg)  BMI 35 kg/m2  Gen: alert, NAD, non-toxic appearing Pupils: equal, no scleral icterus Pulm: Lungs clear to auscultation, symmetric chest rise; port right upper chest CV: regular rate and rhythm Abd: soft, nontender, nondistended. Well-healed lower midline incision. . No incisional hernia Ext: no edema, no calf tenderness Skin: no rash, no jaundice     Assessment:     Undesired port a cath H/o rectal cancer with  pulmonary mets     Plan:     I have also advised the patient and his mother against removal of his Port-A-Cath. However I will honor their wishes. We discussed the risk and benefits of surgery including but not limited to bleeding, infection, injury to surrounding structures, blood clot formation, anesthesia concerns, scarring. We discussed the typical postoperative recovery course. He will be scheduled for removal of his portacatheter in the near future.  Mary Sella. Andrey Campanile, MD, FACS General, Bariatric, & Minimally Invasive Surgery [facility], [place]

## 2012-11-25 NOTE — Telephone Encounter (Signed)
Pt's wife called inquired when was the alst appt for portacath flush gave her date of last appt in May, pt has made appt to remove poratacth by Dr. Joice Lofts, nurse notified

## 2012-12-01 ENCOUNTER — Telehealth: Payer: Self-pay | Admitting: Oncology

## 2012-12-01 NOTE — Telephone Encounter (Signed)
Gave pt appt for flush 12/03/12 portacath flush

## 2012-12-03 ENCOUNTER — Ambulatory Visit (HOSPITAL_BASED_OUTPATIENT_CLINIC_OR_DEPARTMENT_OTHER): Payer: Medicare Other

## 2012-12-03 VITALS — BP 124/77 | HR 63 | Temp 98.6°F | Resp 18

## 2012-12-03 DIAGNOSIS — C20 Malignant neoplasm of rectum: Secondary | ICD-10-CM

## 2012-12-03 MED ORDER — HEPARIN SOD (PORK) LOCK FLUSH 100 UNIT/ML IV SOLN
500.0000 [IU] | Freq: Once | INTRAVENOUS | Status: AC
  Administered 2012-12-03: 500 [IU] via INTRAVENOUS
  Filled 2012-12-03: qty 5

## 2012-12-03 MED ORDER — SODIUM CHLORIDE 0.9 % IJ SOLN
10.0000 mL | INTRAMUSCULAR | Status: DC | PRN
  Administered 2012-12-03: 10 mL via INTRAVENOUS
  Filled 2012-12-03: qty 10

## 2012-12-03 NOTE — Patient Instructions (Signed)
Call MD for problems or concerns 

## 2013-01-07 DIAGNOSIS — Z452 Encounter for adjustment and management of vascular access device: Secondary | ICD-10-CM

## 2013-01-10 ENCOUNTER — Telehealth (INDEPENDENT_AMBULATORY_CARE_PROVIDER_SITE_OTHER): Payer: Self-pay | Admitting: General Surgery

## 2013-01-10 NOTE — Telephone Encounter (Signed)
Spoke with pt's mother and let her know his appt is on 01/28/13 at 2:45

## 2013-01-11 ENCOUNTER — Other Ambulatory Visit: Payer: Self-pay | Admitting: *Deleted

## 2013-01-13 ENCOUNTER — Telehealth: Payer: Self-pay | Admitting: Oncology

## 2013-01-13 NOTE — Telephone Encounter (Signed)
talked to pt and gave him appt for 8/11 ML only

## 2013-01-19 ENCOUNTER — Other Ambulatory Visit: Payer: Self-pay | Admitting: Hematology & Oncology

## 2013-01-24 ENCOUNTER — Ambulatory Visit: Payer: Medicare Other | Admitting: Oncology

## 2013-01-28 ENCOUNTER — Encounter (INDEPENDENT_AMBULATORY_CARE_PROVIDER_SITE_OTHER): Payer: Medicare Other | Admitting: General Surgery

## 2013-01-31 ENCOUNTER — Ambulatory Visit (HOSPITAL_BASED_OUTPATIENT_CLINIC_OR_DEPARTMENT_OTHER): Payer: Medicare Other | Admitting: Nurse Practitioner

## 2013-01-31 ENCOUNTER — Telehealth: Payer: Self-pay | Admitting: *Deleted

## 2013-01-31 VITALS — BP 127/71 | HR 65 | Temp 97.5°F | Resp 20 | Ht 74.5 in | Wt 289.0 lb

## 2013-01-31 DIAGNOSIS — C20 Malignant neoplasm of rectum: Secondary | ICD-10-CM

## 2013-01-31 DIAGNOSIS — C7800 Secondary malignant neoplasm of unspecified lung: Secondary | ICD-10-CM

## 2013-01-31 DIAGNOSIS — Z809 Family history of malignant neoplasm, unspecified: Secondary | ICD-10-CM

## 2013-01-31 NOTE — Telephone Encounter (Signed)
appts made and printed...td 

## 2013-01-31 NOTE — Progress Notes (Signed)
OFFICE PROGRESS NOTE  Interval history:  [name] returns as scheduled. He feels well. No complaints. He has a good appetite. He is gaining weight. He denies pain. Specifically no pain with bowel movements. No rectal bleeding. No nausea or vomiting.   Objective: Blood pressure 127/71, pulse 65, temperature 97.5 F (36.4 C), temperature source Oral, resp. rate 20, height 6' 2.5" (1.892 m), weight 289 lb (131.09 kg), SpO2 100.00%.  Oropharynx is without thrush or ulceration. No palpable cervical, supraclavicular, axillary or inguinal lymph nodes. Lungs are clear. No wheezes or rales. Regular cardiac rhythm. Abdomen is soft and nontender. Obese. No obvious hepatomegaly. Trace lower leg edema bilaterally left greater than right. 3-4 cm soft subcutaneous lesion at the left posterior neck consistent with a lipoma.  Lab Results: Lab Results  Component Value Date   WBC 3.8* 09/15/2012   HGB 12.9* 09/15/2012   HCT 38.1* 09/15/2012   MCV 93.4 09/15/2012   PLT 146 09/15/2012    Chemistry:    Chemistry      Component Value Date/Time   NA 141 07/07/2012 0828   NA 142 02/04/2012 0941   NA 141 10/27/2011 0857   K 3.9 07/07/2012 0828   K 4.0 02/04/2012 0941   K 4.4 10/27/2011 0857   CL 107 07/07/2012 0828   CL 105 02/04/2012 0941   CL 98 10/27/2011 0857   CO2 28 07/07/2012 0828   CO2 28 02/04/2012 0941   CO2 29 10/27/2011 0857   BUN 10.0 07/07/2012 0828   BUN 14 02/04/2012 0941   BUN 17 10/27/2011 0857   CREATININE 0.9 07/07/2012 0828   CREATININE 0.91 02/04/2012 0941   CREATININE 1.1 10/27/2011 0857      Component Value Date/Time   CALCIUM 9.1 07/07/2012 0828   CALCIUM 8.9 02/04/2012 0941   CALCIUM 9.0 10/27/2011 0857   ALKPHOS 43 07/07/2012 0828   ALKPHOS 43 02/04/2012 0941   ALKPHOS 41 10/27/2011 0857   AST 16 07/07/2012 0828   AST 29 02/04/2012 0941   AST 31 10/27/2011 0857   ALT 7 07/07/2012 0828   ALT 29 02/04/2012 0941   ALT 26 10/27/2011 0857   BILITOT 0.66 07/07/2012 0828   BILITOT 0.4 02/04/2012 0941    BILITOT 0.70 10/27/2011 0857       Studies/Results: No results found.  Medications: I have reviewed the patient's current medications.  Assessment/Plan:  1.Rectal cancer, clinical uT3 uN0, status post concurrent chemotherapy/radiation with radiation completed March 11, 2010. He is status post laparoscopic-assisted low anterior resection with diverting loop ileostomy 05/30/2010 with final pathology showing no evidence of malignancy. He declined adjuvant chemotherapy.  2. History of sigmoid colon polyp at 20.0 cm status post biopsy confirming high-grade dysplasia.  3. History of mild anemia likely related to chemotherapy/radiation and rectal bleeding.  4. Mental disability.  5. History of bilateral leg edema status post negative Doppler of the left leg 12/19/2009.  6. Family history of multiple cancers.  7. Colonoscopy 07/14/2011 with finding of a distal rectal mass status post biopsy with pathology showing invasive colorectal adenocarcinoma, moderately differentiated. Biopsy of a polyp in the ascending colon showed a tubular adenoma.  8. CT abdomen and pelvis 06/13/2011 with interval development of multiple pulmonary nodules in both lung bases. Multiple bilateral lung nodules confirmed on a CT of the chest 08/06/2011. Chemotherapy initiated with FOLFOX on 08/20/2011. Restaging CT of the chest on 10/27/2011 after 5 cycles of FOLFOX showed a decrease in the size and number of pulmonary nodules. He  completed cycle 10 FOLFOX on 01/14/2012. Restaging chest CT on 02/02/2012 showed stable to improved disease in the lungs and no new lung nodules. Treatment continued with 5-FU/leucovorin as per the FOLFOX regimen on a 2 week schedule (oxaliplatin deleted from the regimen beginning with cycle 11). Restaging CT 05/07/2012 revealed stable lung nodules, no evidence for progressive metastatic disease. Treatment continued with 5 fluorouracil as per the FOLFOX regimen on a 2 week schedule. Restaging chest CT  09/13/2012 showed progression of pulmonary metastatic disease. A PET scan 09/21/2012 with a hypermetabolic thyroid nodule and no other evidence of hypermetabolic metastatic disease. He declined further chemotherapy. 9. Status post Port-A-Cath placement 08/14/2011.  10. CEA 1.0 on 05/26/2012.  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  12. Neutropenia/thrombocytopenia 11/26/2011 secondary to chemotherapy; persistent neutropenia on 12/03/2011. Oxaliplatin was dose reduced beginning with cycle 8. Oxaliplatin deleted from the regimen beginning with cycle 11.  13. Mild nausea with chemotherapy on 03/31/2012-Zofran was added beginning with chemotherapy on 04/14/2012.  14. Skin hyperpigmentation at the hands related to 5-fluorouracil.  15. Skin rash over the legs 08/04/2012. Question medication related, question related to 5-fluorouracil. Resolved. The skin rash did not recur following 5-fluorouracil 09/01/2012.  16. Thyroid nodule-hypermetabolic on the PET scan 09/21/2012. He and his mother declined proceeding with an ultrasound.  17. Flexible sigmoidoscopy 10/13/2012 with findings of diverticulosis in the descending colon; anastomosis at 20 cm; question [place] ink stain in the rectum. 18. Status post Port-A-Cath removal.  Disposition-[name] appears stable. He continues to do well clinically. He and his mother prefer to continue to be followed on an observation approach. They decline further chemotherapy. We discussed the hypermetabolic thyroid nodule. They understand the potential for malignancy. They decline proceeding with a thyroid ultrasound at this time. He is agreeable to return for a followup visit in approximately 4 months with a non-contrast CT scan of the chest to followup the lung nodules 2-3 days prior. He will contact the office prior to his next visit with any problems.  Plan reviewed with Dr. Truett Perna.   Lonna Cobb ANP/GNP-BC

## 2013-02-09 ENCOUNTER — Encounter (INDEPENDENT_AMBULATORY_CARE_PROVIDER_SITE_OTHER): Payer: Self-pay | Admitting: General Surgery

## 2013-02-09 ENCOUNTER — Ambulatory Visit (INDEPENDENT_AMBULATORY_CARE_PROVIDER_SITE_OTHER): Payer: Medicare Other | Admitting: General Surgery

## 2013-02-09 VITALS — BP 132/78 | HR 64 | Temp 98.1°F | Resp 14 | Ht 74.5 in | Wt 288.0 lb

## 2013-02-09 DIAGNOSIS — Z09 Encounter for follow-up examination after completed treatment for conditions other than malignant neoplasm: Secondary | ICD-10-CM

## 2013-02-09 NOTE — Progress Notes (Signed)
Subjective:     Patient ID: [name], male   DOB: 1961/02/09, 52 y.o.   MRN: [id number]  HPI 52 year old [place] American male with a history of rectal cancer comes in after undergoing removal of his Port-A-Cath at the surgical Center [facility] on July 18. He denies any problems since surgery. He denies any fevers or chills. He denies any nausea vomiting. He denies any chest discomfort. He denies any incisional problems.  Review of Systems     Objective:   Physical Exam BP 132/78  Pulse 64  Temp(Src) 98.1 F (36.7 C) (Temporal)  Resp 14  Ht 6' 2.5" (1.892 m)  Wt 288 lb (130.636 kg)  BMI 36.49 kg/m2 Alert, no apparent distress Chest-symmetric chest rise, well-healed right upper chest wall incision. No evidence of cellulitis, induration, fluctuance, or hematoma    Assessment:     Status post  Port-A-Cath removal  Personal history of stage IV rectal cancer    Plan:     He has done well since removal of his Port-A-Cath. He was released to full activities. I reminded the patient and his mother that he needed ongoing oncologic followup at the cancer Center. Followup as needed  Mary Sella. Andrey Campanile, MD, FACS General, Bariatric, & Minimally Invasive Surgery [facility], [place]

## 2013-06-02 ENCOUNTER — Ambulatory Visit (HOSPITAL_COMMUNITY)
Admission: RE | Admit: 2013-06-02 | Discharge: 2013-06-02 | Disposition: A | Discharge status: Home / Self Care | Payer: Medicare Other | Source: Ambulatory Visit | Attending: Nurse Practitioner | Admitting: Nurse Practitioner

## 2013-06-02 DIAGNOSIS — Z923 Personal history of irradiation: Secondary | ICD-10-CM | POA: Insufficient documentation

## 2013-06-02 DIAGNOSIS — R918 Other nonspecific abnormal finding of lung field: Secondary | ICD-10-CM | POA: Insufficient documentation

## 2013-06-02 DIAGNOSIS — E041 Nontoxic single thyroid nodule: Secondary | ICD-10-CM | POA: Insufficient documentation

## 2013-06-02 DIAGNOSIS — C20 Malignant neoplasm of rectum: Secondary | ICD-10-CM | POA: Insufficient documentation

## 2013-06-02 DIAGNOSIS — Z9221 Personal history of antineoplastic chemotherapy: Secondary | ICD-10-CM | POA: Insufficient documentation

## 2013-06-06 ENCOUNTER — Encounter (INDEPENDENT_AMBULATORY_CARE_PROVIDER_SITE_OTHER): Payer: Self-pay

## 2013-06-06 ENCOUNTER — Ambulatory Visit (HOSPITAL_BASED_OUTPATIENT_CLINIC_OR_DEPARTMENT_OTHER): Payer: Medicare Other | Admitting: Oncology

## 2013-06-06 ENCOUNTER — Telehealth: Payer: Self-pay | Admitting: Oncology

## 2013-06-06 VITALS — BP 131/81 | HR 70 | Temp 97.5°F | Resp 18 | Ht 74.0 in | Wt 290.7 lb

## 2013-06-06 DIAGNOSIS — C7800 Secondary malignant neoplasm of unspecified lung: Secondary | ICD-10-CM

## 2013-06-06 DIAGNOSIS — Z809 Family history of malignant neoplasm, unspecified: Secondary | ICD-10-CM

## 2013-06-06 DIAGNOSIS — C20 Malignant neoplasm of rectum: Secondary | ICD-10-CM

## 2013-06-06 NOTE — Progress Notes (Signed)
[facility]    OFFICE PROGRESS NOTE   INTERVAL HISTORY:   [name] for scheduled followup of metastatic rectal cancer. He is here with his mother and sister. He feels well. Good appetite. No shortness of breath. He has and in frequent cough. No other complaint. The Port-A-Cath was removed by Dr. Andrey Campanile on 01/07/2013.  Objective:  Vital signs in last 24 hours:  Blood pressure 131/81, pulse 70, temperature 97.5 F (36.4 C), temperature source Oral, resp. rate 18, height 6\' 2"  (1.88 m), weight 290 lb 11.2 oz (131.861 kg).    HEENT: Neck without mass Lymphatics: No cervical, supraclavicular, axillary, or inguinal nodes Resp: Lungs clear bilaterally, distant breath sounds, no respiratory distress Cardio: Regular rate and rhythm GI: No hepatomegaly, nontender, no mass Vascular: Trace edema at the left lower leg with chronic stasis change  Skin: No hyperpigmentation at the hands   X-rays: Restaging CT of the chest on 06/02/2013, compared to a PET scan 09/21/2012 and a chest CT 09/13/2012: Interval enlargement of multiple pulmonary nodules. Multiple new nodules. No evidence of extra pulmonary metastases. Stable right thyroid nodule.   Medications: I have reviewed the patient's current medications.  Assessment/Plan: 1.Rectal cancer, clinical uT3 uN0, K-ras wild-type by standard testing, no loss of mismatch repair protein expression, status post concurrent chemotherapy/radiation with radiation completed March 11, 2010. He is status post laparoscopic-assisted low anterior resection with diverting loop ileostomy 05/30/2010 with final pathology showing no evidence of malignancy. He declined adjuvant chemotherapy.  2. History of sigmoid colon polyp at 20.0 cm status post biopsy confirming high-grade dysplasia.  3. History of mild anemia likely related to chemotherapy/radiation and rectal bleeding.  4. Mental disability.  5. History of bilateral leg edema status post  negative Doppler of the left leg 12/19/2009.  6. Family history of multiple cancers.  7. Colonoscopy 07/14/2011 with finding of a distal rectal mass status post biopsy with pathology showing invasive colorectal adenocarcinoma, moderately differentiated. Biopsy of a polyp in the ascending colon showed a tubular adenoma.  8. CT abdomen and pelvis 06/13/2011 with interval development of multiple pulmonary nodules in both lung bases. Multiple bilateral lung nodules confirmed on a CT of the chest 08/06/2011. Chemotherapy initiated with FOLFOX on 08/20/2011. Restaging CT of the chest on 10/27/2011 after 5 cycles of FOLFOX showed a decrease in the size and number of pulmonary nodules. He completed cycle 10 FOLFOX on 01/14/2012. Restaging chest CT on 02/02/2012 showed stable to improved disease in the lungs and no new lung nodules. Treatment continued with 5-FU/leucovorin as per the FOLFOX regimen on a 2 week schedule (oxaliplatin deleted from the regimen beginning with cycle 11). Restaging CT 05/07/2012 revealed stable lung nodules, no evidence for progressive metastatic disease. Treatment continued with 5 fluorouracil as per the FOLFOX regimen on a 2 week schedule. Restaging chest CT 09/13/2012 showed progression of pulmonary metastatic disease. A PET scan 09/21/2012 with a hypermetabolic thyroid nodule and no other evidence of hypermetabolic metastatic disease. He declined further chemotherapy.  CT of the chest 06/02/2013 with an increase in the size of bilateral lung nodules and new nodules  9. Status post Port-A-Cath placement 08/14/2011. Port-A-Cath removed 01/07/2013 10. CEA 1.0 on 05/26/2012.  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  12. Neutropenia/thrombocytopenia 11/26/2011 secondary to chemotherapy; persistent neutropenia on 12/03/2011. Oxaliplatin was dose reduced beginning with cycle 8. Oxaliplatin deleted from the regimen beginning with cycle 11.  13. Mild nausea with  chemotherapy on 03/31/2012-Zofran was added beginning with chemotherapy  on 04/14/2012.  14. Skin hyperpigmentation at the hands related to 5-fluorouracil. Resolved. 15. Skin rash over the legs 08/04/2012. Question medication related, question related to 5-fluorouracil. Resolved. The skin rash did not recur following 5-fluorouracil 09/01/2012.  16. Thyroid nodule-hypermetabolic on the PET scan 09/21/2012. He and his mother declined proceeding with an ultrasound.  17. Flexible sigmoidoscopy 10/13/2012 with findings of diverticulosis in the descending colon; anastomosis at 20 cm; question [place] ink stain in the rectum.    Disposition:  [name] appears stable. I reviewed the CT images with the patient and his family. I explained the CT evidence of disease progression. He has metastatic rectal cancer and no therapy will be curative. There are multiple systemic chemotherapy and biologic treatment options. Mr. Ries, his mother, and sister repeatedly stated he will not agree to further systemic therapy. They asked about "alternative treatments ". I told them I am not aware of any effective treatment a side from chemotherapy and biologic therapy. He stated that he will not agree to these treatments.  He agrees to a followup CT and office visit in 3 months. They will contact us in the interim if he develops new symptoms. Specifically anorexia, shortness of breath or a consistent cough.     Thornton Papas, MD  06/06/2013  4:02 PM

## 2013-06-06 NOTE — Telephone Encounter (Signed)
worked 12/15 pof appts made AVS and CAL given shh

## 2013-09-02 ENCOUNTER — Ambulatory Visit (HOSPITAL_COMMUNITY)
Admission: RE | Admit: 2013-09-02 | Discharge: 2013-09-02 | Disposition: A | Discharge status: Home / Self Care | Payer: Medicare Other | Source: Ambulatory Visit | Attending: Oncology | Admitting: Oncology

## 2013-09-02 DIAGNOSIS — C20 Malignant neoplasm of rectum: Secondary | ICD-10-CM | POA: Insufficient documentation

## 2013-09-02 DIAGNOSIS — R918 Other nonspecific abnormal finding of lung field: Secondary | ICD-10-CM | POA: Insufficient documentation

## 2013-09-02 DIAGNOSIS — C7800 Secondary malignant neoplasm of unspecified lung: Secondary | ICD-10-CM | POA: Insufficient documentation

## 2013-09-06 ENCOUNTER — Ambulatory Visit (HOSPITAL_BASED_OUTPATIENT_CLINIC_OR_DEPARTMENT_OTHER): Payer: Medicare Other | Admitting: Nurse Practitioner

## 2013-09-06 ENCOUNTER — Telehealth: Payer: Self-pay | Admitting: Oncology

## 2013-09-06 ENCOUNTER — Other Ambulatory Visit: Payer: Medicare Other

## 2013-09-06 VITALS — BP 148/66 | HR 63 | Temp 97.5°F | Resp 18 | Ht 74.0 in | Wt 291.9 lb

## 2013-09-06 DIAGNOSIS — Z809 Family history of malignant neoplasm, unspecified: Secondary | ICD-10-CM

## 2013-09-06 DIAGNOSIS — E041 Nontoxic single thyroid nodule: Secondary | ICD-10-CM

## 2013-09-06 DIAGNOSIS — F489 Nonpsychotic mental disorder, unspecified: Secondary | ICD-10-CM

## 2013-09-06 DIAGNOSIS — C7800 Secondary malignant neoplasm of unspecified lung: Secondary | ICD-10-CM

## 2013-09-06 DIAGNOSIS — C20 Malignant neoplasm of rectum: Secondary | ICD-10-CM

## 2013-09-06 NOTE — Telephone Encounter (Signed)
Gave pt appt for MD for july 2015 °

## 2013-09-06 NOTE — Progress Notes (Addendum)
OFFICE PROGRESS NOTE  Interval history:  [name] returns for followup of metastatic rectal cancer. He is accompanied to today's visit by his mother and sister. He reports that he feels well. He has a good appetite. His mother reports he is gaining weight. He denies shortness of breath. No cough. No fever. He denies pain. No nausea or vomiting. No constipation or diarrhea. No rectal bleeding.   Objective: Filed Vitals:   09/06/13 1401  BP: 148/66  Pulse: 63  Temp: 97.5 F (36.4 C)  Resp: 18   Oropharynx is without thrush or ulceration. No palpable cervical or supraclavicular lymph nodes. Lungs are clear. No wheezes or rales. Regular cardiac rhythm. Abdomen soft and nontender. No organomegaly. No mass. 1+ edema at the left lower leg with chronic stasis changes.   Lab Results: Lab Results  Component Value Date   WBC 3.8* 09/15/2012   HGB 12.9* 09/15/2012   HCT 38.1* 09/15/2012   MCV 93.4 09/15/2012   PLT 146 09/15/2012   NEUTROABS 2.7 09/15/2012    Chemistry:    Chemistry      Component Value Date/Time   NA 141 07/07/2012 0828   NA 142 02/04/2012 0941   NA 141 10/27/2011 0857   K 3.9 07/07/2012 0828   K 4.0 02/04/2012 0941   K 4.4 10/27/2011 0857   CL 107 07/07/2012 0828   CL 105 02/04/2012 0941   CL 98 10/27/2011 0857   CO2 28 07/07/2012 0828   CO2 28 02/04/2012 0941   CO2 29 10/27/2011 0857   BUN 10.0 07/07/2012 0828   BUN 14 02/04/2012 0941   BUN 17 10/27/2011 0857   CREATININE 0.9 07/07/2012 0828   CREATININE 0.91 02/04/2012 0941   CREATININE 1.1 10/27/2011 0857      Component Value Date/Time   CALCIUM 9.1 07/07/2012 0828   CALCIUM 8.9 02/04/2012 0941   CALCIUM 9.0 10/27/2011 0857   ALKPHOS 43 07/07/2012 0828   ALKPHOS 43 02/04/2012 0941   ALKPHOS 41 10/27/2011 0857   AST 16 07/07/2012 0828   AST 29 02/04/2012 0941   AST 31 10/27/2011 0857   ALT 7 07/07/2012 0828   ALT 29 02/04/2012 0941   ALT 26 10/27/2011 0857   BILITOT 0.66 07/07/2012 0828   BILITOT 0.4 02/04/2012 0941   BILITOT 0.70  10/27/2011 0857       Studies/Results: Ct Chest Wo Contrast  09/02/2013   CLINICAL DATA:  Rectal cancer with lung mets.  EXAM: CT CHEST WITHOUT CONTRAST  TECHNIQUE: Multidetector CT imaging of the chest was performed following the standard protocol without IV contrast.  COMPARISON:  06/02/2013  FINDINGS: There is no axillary lymphadenopathy. No mediastinal or hilar lymphadenopathy. The heart size is normal. No pericardial or pleural effusion.  Innumerable bilateral pulmonary nodules are again noted. One of the largest nodules is in the posterior left costophrenic sulcus and was measured previously at 2.2 cm. Measuring today in the same dimension, it measures 2.2 cm. A second index nodule measured in the posterior right costophrenic sulcus was 1.7 cm. Measuring that nodule in the same dimension today it is 1.7 cm.  While the 2 previously measured index nodules are unchanged. Other nodules are evident that have increased in size. A nodule at the dome of the left hemidiaphragm (image 44 series 5) measures 13 mm today. When I remeasure that same nodule on the previous study, it was 11 mm.  A 1.9 x 1.2 cm nodule in the posterior right lower lobe on image 43  represents the confluence of 2 adjacent nodules seen on the previous study. The larger of these 2 nodules on the previous study measured 12 mm.  The 10 mm left upper lobe nodule on image 27 was 7 mm on the previous study.  8 mm right middle lobe nodule on image 30 with 6 mm previously.  Bone windows reveal no worrisome lytic or sclerotic osseous lesions.  IMPRESSION: Innumerable bilateral pulmonary nodules. While the 2 index nodules measured on the previous study are stable in the interval, numerous other bilateral pulmonary nodules are larger, consistent with overall continued interval progression of pulmonary metastases.   Electronically Signed   By: Misty Stanley M.D.   On: 09/02/2013 14:54    Medications: I have reviewed the patient's current  medications.  Assessment/Plan: 1.Rectal cancer, clinical uT3 uN0, K-ras wild-type by standard testing, no loss of mismatch repair protein expression, status post concurrent chemotherapy/radiation with radiation completed March 11, 2010. He is status post laparoscopic-assisted low anterior resection with diverting loop ileostomy 05/30/2010 with final pathology showing no evidence of malignancy. He declined adjuvant chemotherapy.  2. History of sigmoid colon polyp at 20.0 cm status post biopsy confirming high-grade dysplasia.  3. History of mild anemia likely related to chemotherapy/radiation and rectal bleeding.  4. Mental disability.  5. History of bilateral leg edema status post negative Doppler of the left leg 12/19/2009.  6. Family history of multiple cancers.  7. Colonoscopy 07/14/2011 with finding of a distal rectal mass status post biopsy with pathology showing invasive colorectal adenocarcinoma, moderately differentiated. Biopsy of a polyp in the ascending colon showed a tubular adenoma.  8. CT abdomen and pelvis 06/13/2011 with interval development of multiple pulmonary nodules in both lung bases. Multiple bilateral lung nodules confirmed on a CT of the chest 08/06/2011. Chemotherapy initiated with FOLFOX on 08/20/2011. Restaging CT of the chest on 10/27/2011 after 5 cycles of FOLFOX showed a decrease in the size and number of pulmonary nodules. He completed cycle 10 FOLFOX on 01/14/2012. Restaging chest CT on 02/02/2012 showed stable to improved disease in the lungs and no new lung nodules. Treatment continued with 5-FU/leucovorin as per the FOLFOX regimen on a 2 week schedule (oxaliplatin deleted from the regimen beginning with cycle 11). Restaging CT 05/07/2012 revealed stable lung nodules, no evidence for progressive metastatic disease. Treatment continued with 5 fluorouracil as per the FOLFOX regimen on a 2 week schedule. Restaging chest CT 09/13/2012 showed progression of pulmonary  metastatic disease. A PET scan 09/21/2012 with a hypermetabolic thyroid nodule and no other evidence of hypermetabolic metastatic disease. He declined further chemotherapy.  CT of the chest 06/02/2013 with an increase in the size of bilateral lung nodules and new nodules. CT of the chest 09/02/2013 with innumerable bilateral pulmonary nodules. 2 index nodules measured stable; numerous other bilateral pulmonary nodules were larger consistent with continued interval progression. 9. Status post Port-A-Cath placement 08/14/2011. Port-A-Cath removed 01/07/2013.  10. CEA 1.0 on 05/26/2012.  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  12. Neutropenia/thrombocytopenia 11/26/2011 secondary to chemotherapy; persistent neutropenia on 12/03/2011. Oxaliplatin was dose reduced beginning with cycle 8. Oxaliplatin deleted from the regimen beginning with cycle 11.  13. Mild nausea with chemotherapy on 03/31/2012-Zofran was added beginning with chemotherapy on 04/14/2012.  14. Skin hyperpigmentation at the hands related to 5-fluorouracil. Resolved.  15. Skin rash over the legs 08/04/2012. Question medication related, question related to 5-fluorouracil. Resolved. The skin rash did not recur following 5-fluorouracil 09/01/2012.  16. Thyroid nodule-hypermetabolic on the PET  scan 09/21/2012. He and his mother declined proceeding with an ultrasound.  17. Flexible sigmoidoscopy 10/13/2012 with findings of diverticulosis in the descending colon; anastomosis at 20 cm; question [place] ink stain in the rectum.   Dispositon-he appears stable. Dr. Benay Spice reviewed the CT images on the computer with [name] and his family. They understand the recent scan indicates continued disease progression. [name], his mother and sister decline further systemic therapy.  He and his family are agreeable to returning for a followup CT and office visit in 3 months. They will contact the office in the interim with new  symptoms such as shortness of breath, cough, pain.   We will request extended K-ras testing.   Patient seen with Dr. Benay Spice.   Ned Card ANP/GNP-BC  This was a shared visit with Ned Card. I reviewed the restaging chest CT with [name] and his family. The lung nodules are enlarging. [name] stated he does not wish to receive further chemotherapy. He understands the metastatic rectal cancer will continue to progress.  [name], his mother, and sister stated he will not agree to further chemotherapy.  [name], M.D.

## 2013-09-07 ENCOUNTER — Encounter: Payer: Self-pay | Admitting: *Deleted

## 2013-09-07 NOTE — [facility] Oncology Navigator Note (Signed)
Per Dr. Benay Spice, hold off on Extended KRAS testing as patient currently declining chemotherapy.  Will send off for testing in future if patient/family change their mind.

## 2013-12-26 ENCOUNTER — Ambulatory Visit (HOSPITAL_COMMUNITY): Payer: Medicare Other

## 2014-01-02 ENCOUNTER — Other Ambulatory Visit: Payer: Self-pay | Admitting: *Deleted

## 2014-01-02 ENCOUNTER — Telehealth: Payer: Self-pay | Admitting: Oncology

## 2014-01-02 ENCOUNTER — Ambulatory Visit: Payer: Medicare Other | Admitting: Oncology

## 2014-01-02 NOTE — Progress Notes (Signed)
[place] for appointment today. POF to scheduler.

## 2014-01-02 NOTE — Telephone Encounter (Signed)
lmonvm of the pt's mother advising her of the aug 2015 appts and to call back if the d/t's are not good for them.

## 2014-01-05 ENCOUNTER — Telehealth: Payer: Self-pay | Admitting: *Deleted

## 2014-01-05 NOTE — Telephone Encounter (Signed)
Called to report [name] and his mom are out of town through September. He is doing well. They will call to make appointment when they return.

## 2014-02-13 ENCOUNTER — Ambulatory Visit: Payer: Medicare Other | Admitting: Oncology

## 2014-02-13 ENCOUNTER — Other Ambulatory Visit: Payer: Medicare Other

## 2014-03-31 ENCOUNTER — Telehealth: Payer: Self-pay | Admitting: *Deleted

## 2014-03-31 NOTE — Telephone Encounter (Signed)
Called pt's mother to schedule follow up. She stated she will call back on 10/12 with dates they are available for an appt.

## 2014-04-05 IMAGING — CT CT CHEST W/ CM
2 of 3 series · 15 of 36 positions shown, 18 images · IV contrast (OMNIPAQUE)
Comparison: Chest CT 02/02/2012 and 10/27/2011.

CLINICAL DATA: Restaging metastatic rectal cancer.  Follow-up
pulmonary nodules.  Radiation therapy completed in 9844.
Chemotherapy ongoing.

CT CHEST WITH CONTRAST
TECHNIQUE: Multidetector CT imaging of the chest was performed
following the standard protocol during bolus administration of
intravenous contrast.
Contrast: 80mL OMNIPAQUE IOHEXOL 300 MG/ML  SOLN

[Series 2: chest with st · axial · 0.88mm/px · z∈[-312,-22]mm · 12 of 68 slices shown, 15 images]
[im 5/68  mediastinal]
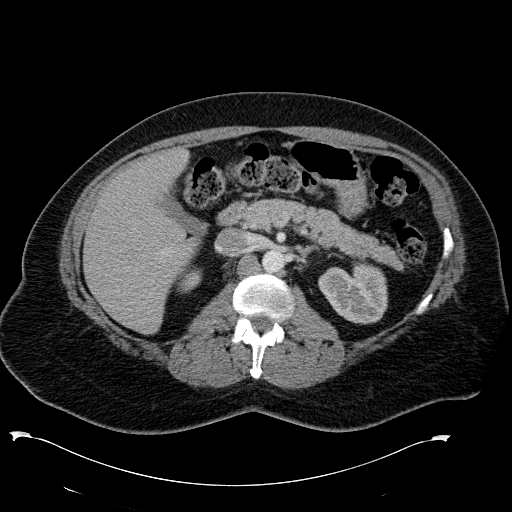
[im 5/68  lung]
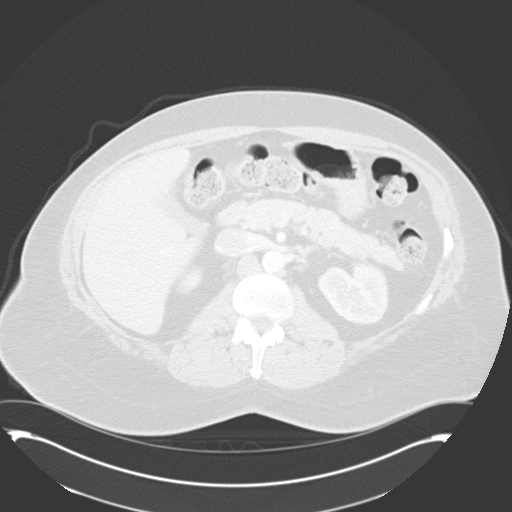
[im 10/68  lung]
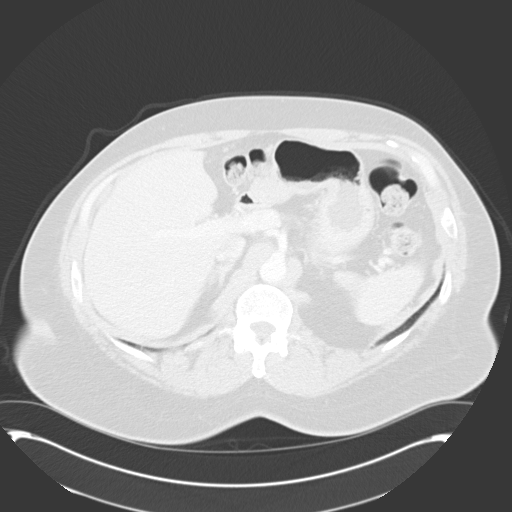
[im 15/68  lung]
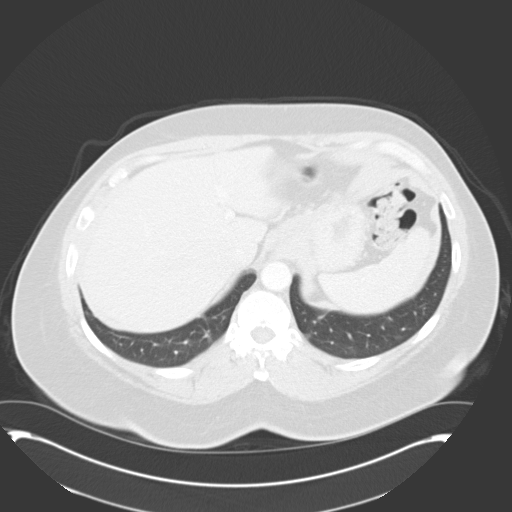
[im 20/68  lung]
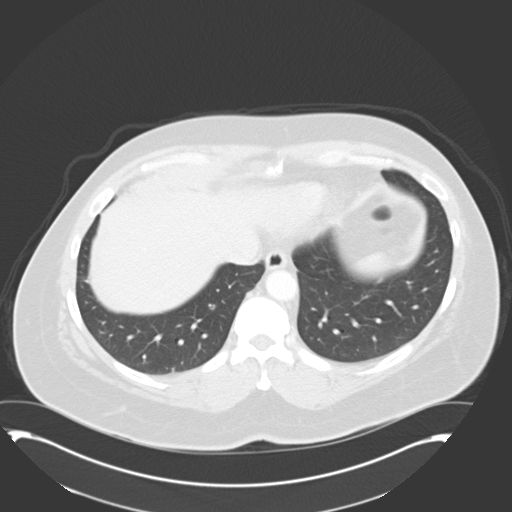
[im 25/68  mediastinal]
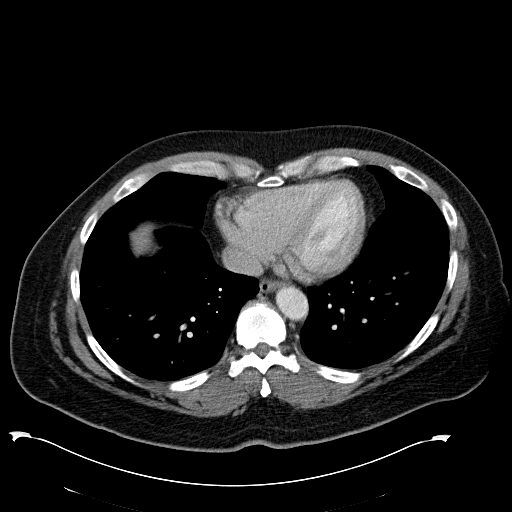
[im 25/68  lung]
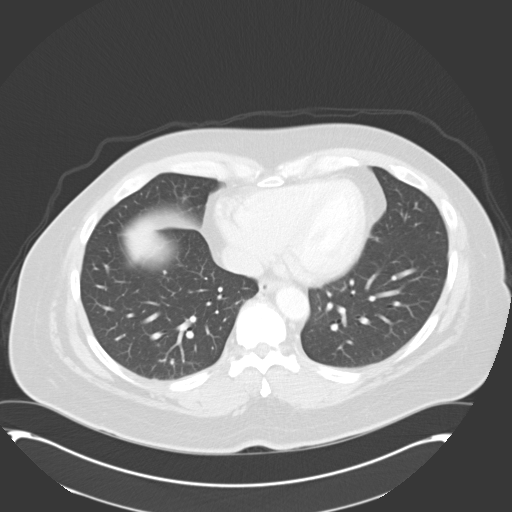
[im 30/68  lung]
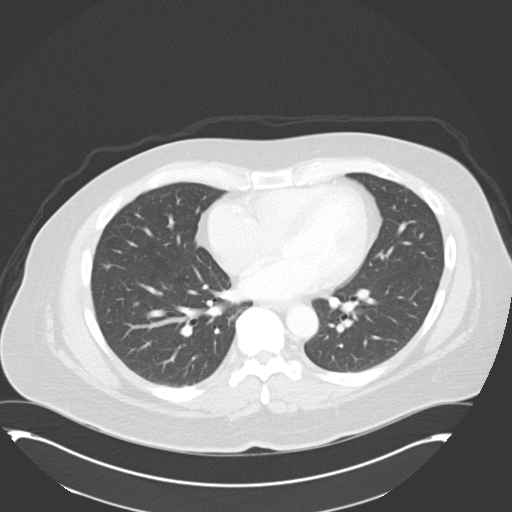
[im 38/68  lung]
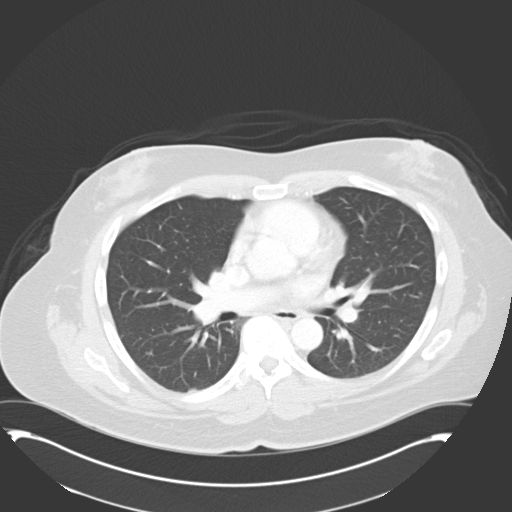
[im 43/68  lung]
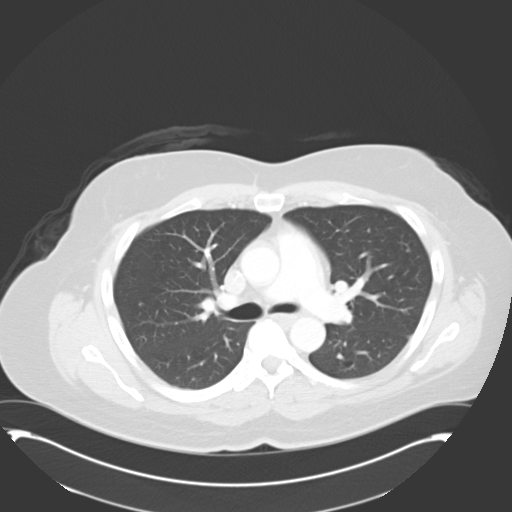
[im 48/68  mediastinal]
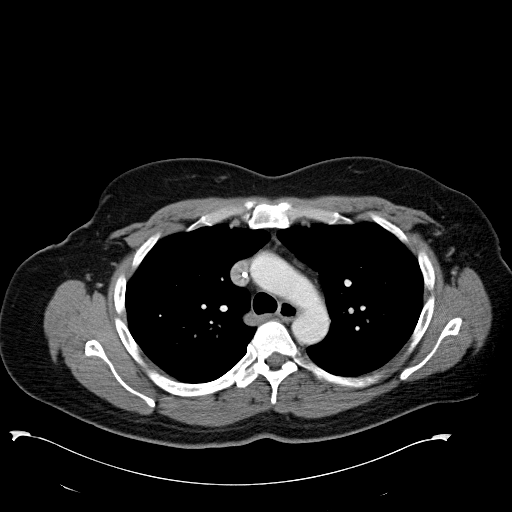
[im 48/68  lung]
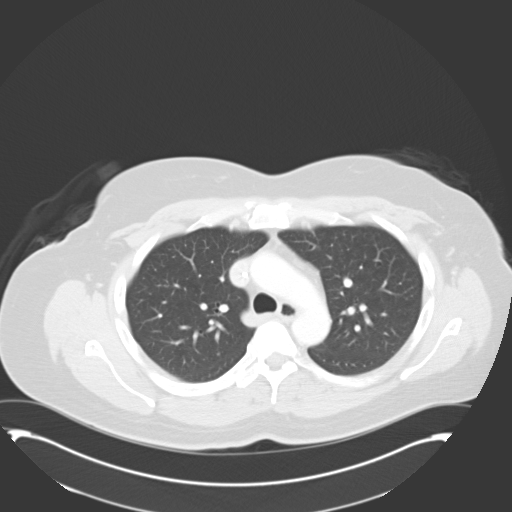
[im 53/68  lung]
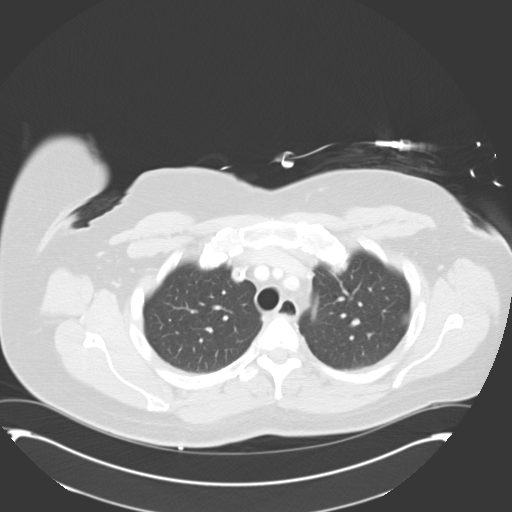
[im 58/68  lung]
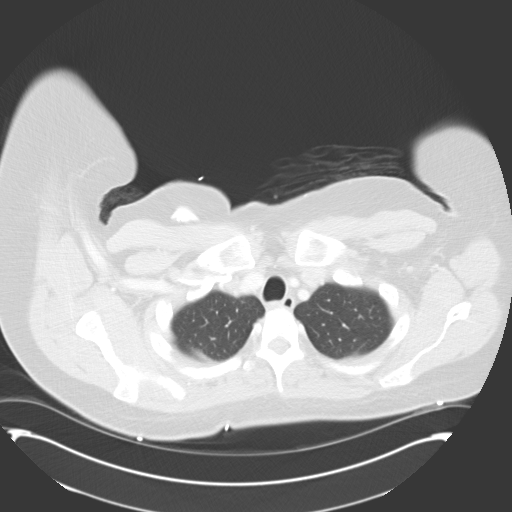
[im 63/68  lung]
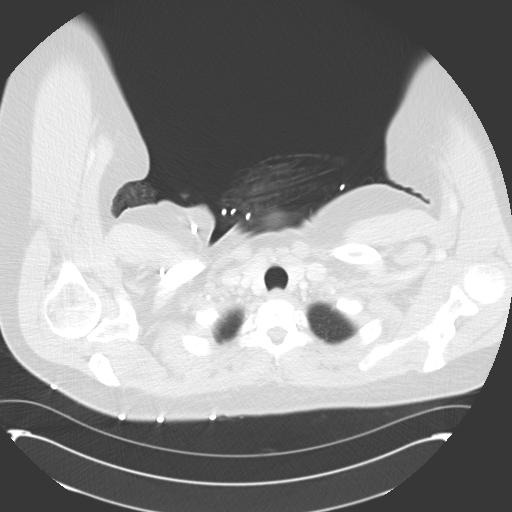

[Series 602: <mpr thick range> · coronal · 0.88mm/px · 3 of 91 slices shown]
[im 19/91  lung]
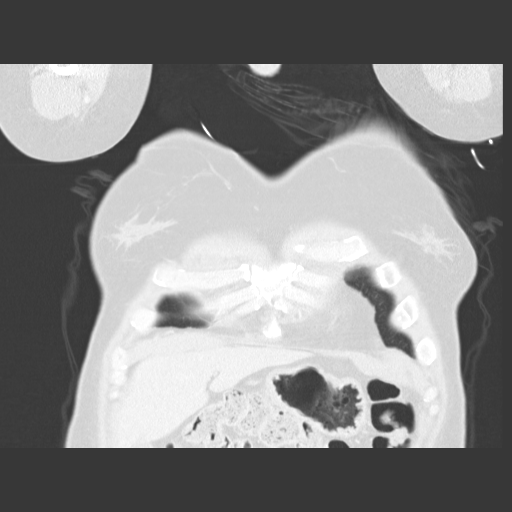
[im 37/91  lung]
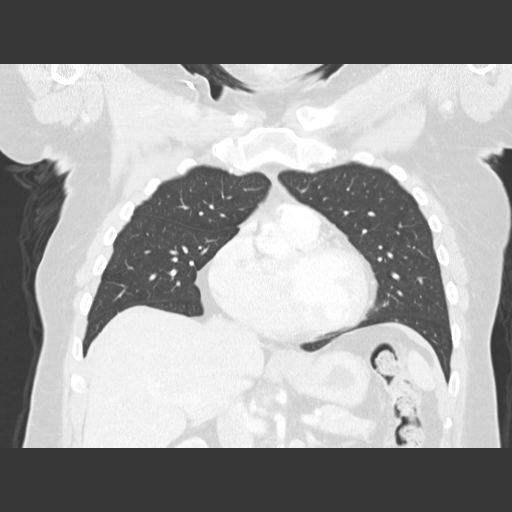
[im 55/91  lung]
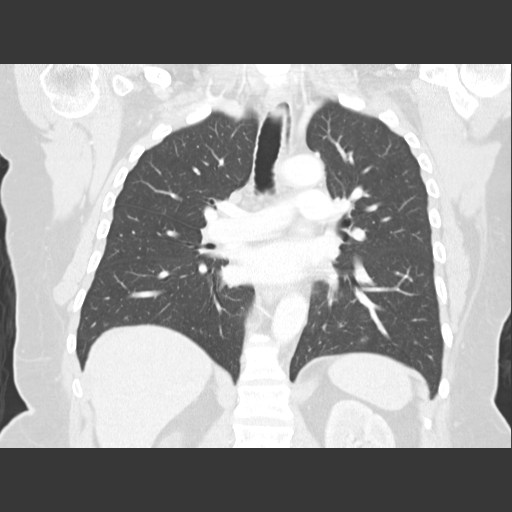

[15 of 36 positions shown; findings below may reference images not displayed]

FINDINGS: Right subclavian Port-A-Cath tip is unchanged in the
lower SVC.  There are no enlarged axillary, mediastinal or hilar
lymph nodes.  The heart, aorta and great vessels appear normal.
There is a small hiatal hernia.

There is no pleural or pericardial effusion.  Numerous sub
centimeter right greater left pulmonary nodules are again
demonstrated, mostly unchanged from the most recent study.  One of
the largest nodules previously measured 6 mm in the right lower
lobe on image 42 has now improved and is barely discernible (image
48).  There is one nodule in the left lower lobe which measures 7
mm on image 53 which appears slightly larger.  No other enlarging
nodules are identified.

The visualized upper abdomen has a stable appearance.  There are no
suspicious osseous findings.
IMPRESSION: 1.  The vast majority of the residual small pulmonary nodules have
not changed from most recent study.  There is a single left lower
lobe nodule which appears slightly larger.
2.  No adenopathy or pleural effusion.

## 2014-04-12 ENCOUNTER — Telehealth: Payer: Self-pay | Admitting: *Deleted

## 2014-04-12 NOTE — Telephone Encounter (Signed)
Call from pt's sister requesting a letter. She is trying to get him "some meds" and needs his diagnosis written down and mailed to his home. When asked to clarify, [name] stated "a friend of ours can get him a discount on meds to boost his immune system." They need documentation from this office that pt has cancer. Cautioned [name] about unregulated supplements- she stated she did not intend to start pt on "anything like that." Per [name], pt does not want to come in for appt. She will have to discuss it with him again and she thinks she can get him to come in toward the end of the year.  Mailed copy of last after visit summary to pt's home.

## 2014-05-19 ENCOUNTER — Other Ambulatory Visit: Payer: Self-pay | Admitting: *Deleted

## 2014-05-19 DIAGNOSIS — C20 Malignant neoplasm of rectum: Secondary | ICD-10-CM

## 2014-05-19 DIAGNOSIS — C7800 Secondary malignant neoplasm of unspecified lung: Principal | ICD-10-CM

## 2014-05-22 ENCOUNTER — Telehealth: Payer: Self-pay | Admitting: Oncology

## 2014-05-22 NOTE — Telephone Encounter (Signed)
Spk w/pt's mom Rod Holler confirming apt per 11/27 POF, mailed sch to pt per Ruth's request.... KJ

## 2014-06-14 ENCOUNTER — Emergency Department (HOSPITAL_COMMUNITY): Payer: Medicare Other

## 2014-06-14 ENCOUNTER — Encounter (HOSPITAL_COMMUNITY): Payer: Self-pay | Admitting: *Deleted

## 2014-06-14 ENCOUNTER — Inpatient Hospital Stay (HOSPITAL_COMMUNITY): Payer: Medicare Other

## 2014-06-14 ENCOUNTER — Inpatient Hospital Stay (HOSPITAL_COMMUNITY)
Admission: EM | Admit: 2014-06-14 | Discharge: 2014-06-23 | DRG: 296 | Disposition: E | Payer: Medicare Other | Attending: Pulmonary Disease | Admitting: Pulmonary Disease

## 2014-06-14 DIAGNOSIS — I469 Cardiac arrest, cause unspecified: Principal | ICD-10-CM

## 2014-06-14 DIAGNOSIS — J9602 Acute respiratory failure with hypercapnia: Secondary | ICD-10-CM | POA: Diagnosis present

## 2014-06-14 DIAGNOSIS — Z515 Encounter for palliative care: Secondary | ICD-10-CM | POA: Diagnosis not present

## 2014-06-14 DIAGNOSIS — E875 Hyperkalemia: Secondary | ICD-10-CM | POA: Diagnosis present

## 2014-06-14 DIAGNOSIS — R001 Bradycardia, unspecified: Secondary | ICD-10-CM | POA: Diagnosis present

## 2014-06-14 DIAGNOSIS — C20 Malignant neoplasm of rectum: Secondary | ICD-10-CM | POA: Diagnosis present

## 2014-06-14 DIAGNOSIS — E872 Acidosis: Secondary | ICD-10-CM | POA: Diagnosis present

## 2014-06-14 DIAGNOSIS — J9601 Acute respiratory failure with hypoxia: Secondary | ICD-10-CM | POA: Diagnosis present

## 2014-06-14 DIAGNOSIS — D649 Anemia, unspecified: Secondary | ICD-10-CM | POA: Diagnosis present

## 2014-06-14 DIAGNOSIS — N179 Acute kidney failure, unspecified: Secondary | ICD-10-CM | POA: Diagnosis present

## 2014-06-14 DIAGNOSIS — R000 Tachycardia, unspecified: Secondary | ICD-10-CM | POA: Diagnosis present

## 2014-06-14 DIAGNOSIS — R570 Cardiogenic shock: Secondary | ICD-10-CM | POA: Diagnosis present

## 2014-06-14 DIAGNOSIS — G9341 Metabolic encephalopathy: Secondary | ICD-10-CM | POA: Diagnosis present

## 2014-06-14 DIAGNOSIS — Z66 Do not resuscitate: Secondary | ICD-10-CM | POA: Diagnosis present

## 2014-06-14 DIAGNOSIS — R4182 Altered mental status, unspecified: Secondary | ICD-10-CM

## 2014-06-14 DIAGNOSIS — J9600 Acute respiratory failure, unspecified whether with hypoxia or hypercapnia: Secondary | ICD-10-CM

## 2014-06-14 LAB — CBC
HCT: 30.6 % — ABNORMAL LOW (ref 39.0–52.0)
HEMOGLOBIN: 9 g/dL — AB (ref 13.0–17.0)
MCH: 26.3 pg (ref 26.0–34.0)
MCHC: 29.4 g/dL — AB (ref 30.0–36.0)
MCV: 89.5 fL (ref 78.0–100.0)
PLATELETS: 177 10*3/uL (ref 150–400)
RBC: 3.42 MIL/uL — ABNORMAL LOW (ref 4.22–5.81)
RDW: 17.2 % — AB (ref 11.5–15.5)
WBC: 8.3 10*3/uL (ref 4.0–10.5)

## 2014-06-14 LAB — PROTIME-INR
INR: 1.57 — ABNORMAL HIGH (ref 0.00–1.49)
Prothrombin Time: 18.9 seconds — ABNORMAL HIGH (ref 11.6–15.2)

## 2014-06-14 LAB — APTT: APTT: 47 s — AB (ref 24–37)

## 2014-06-14 LAB — I-STAT ARTERIAL BLOOD GAS, ED
Acid-base deficit: 14 mmol/L — ABNORMAL HIGH (ref 0.0–2.0)
Bicarbonate: 12.5 mEq/L — ABNORMAL LOW (ref 20.0–24.0)
O2 SAT: 100 %
PCO2 ART: 33.6 mmHg — AB (ref 35.0–45.0)
PO2 ART: 365 mmHg — AB (ref 80.0–100.0)
Patient temperature: 100
TCO2: 13 mmol/L (ref 0–100)
pH, Arterial: 7.184 — CL (ref 7.350–7.450)

## 2014-06-14 LAB — BASIC METABOLIC PANEL
ANION GAP: 18 — AB (ref 5–15)
BUN: 21 mg/dL (ref 6–23)
CALCIUM: 9.1 mg/dL (ref 8.4–10.5)
CO2: 15 mmol/L — ABNORMAL LOW (ref 19–32)
Chloride: 106 mEq/L (ref 96–112)
Creatinine, Ser: 1.83 mg/dL — ABNORMAL HIGH (ref 0.50–1.35)
GFR, EST AFRICAN AMERICAN: 47 mL/min — AB (ref >90)
GFR, EST NON AFRICAN AMERICAN: 40 mL/min — AB (ref >90)
Glucose, Bld: 74 mg/dL (ref 70–99)
Potassium: 5.4 mmol/L — ABNORMAL HIGH (ref 3.5–5.1)
SODIUM: 139 mmol/L (ref 135–145)

## 2014-06-14 LAB — CBG MONITORING, ED: GLUCOSE-CAPILLARY: 57 mg/dL — AB (ref 70–99)

## 2014-06-14 LAB — I-STAT CG4 LACTIC ACID, ED: Lactic Acid, Venous: 12.24 mmol/L — ABNORMAL HIGH (ref 0.5–2.2)

## 2014-06-14 LAB — I-STAT TROPONIN, ED: Troponin i, poc: 0.03 ng/mL (ref 0.00–0.08)

## 2014-06-14 MED ORDER — PIPERACILLIN-TAZOBACTAM 3.375 G IVPB 30 MIN: 3.3750 g | Freq: Once | INTRAVENOUS | Status: DC

## 2014-06-14 MED ORDER — DEXTROSE 5 % IV SOLN
2.0000 ug/min | INTRAVENOUS | Status: DC
  Administered 2014-06-14: 10 ug/min via INTRAVENOUS
  Filled 2014-06-14: qty 4

## 2014-06-14 MED ORDER — ATROPINE SULFATE 1 % OP SOLN
4.0000 [drp] | OPHTHALMIC | Status: DC | PRN
  Filled 2014-06-14: qty 2

## 2014-06-14 MED ORDER — SODIUM CHLORIDE 0.9 % IV SOLN: INTRAVENOUS | Status: DC

## 2014-06-14 MED ORDER — SODIUM CHLORIDE 0.9 % IV SOLN: 250.0000 mL | INTRAVENOUS | Status: DC | PRN

## 2014-06-14 MED ORDER — FENTANYL CITRATE 0.05 MG/ML IJ SOLN
INTRAMUSCULAR | Status: AC
  Filled 2014-06-14: qty 2

## 2014-06-14 MED ORDER — FENTANYL CITRATE 0.05 MG/ML IJ SOLN: 100.0000 ug | INTRAMUSCULAR | Status: DC | PRN

## 2014-06-14 MED ORDER — EPINEPHRINE HCL 1 MG/ML IJ SOLN
0.5000 ug/min | INTRAMUSCULAR | Status: DC
  Administered 2014-06-14: 0.5 ug/min via INTRAVENOUS
  Filled 2014-06-14 (×2): qty 4

## 2014-06-14 MED ORDER — MIDAZOLAM HCL 2 MG/2ML IJ SOLN: 2.0000 mg | INTRAMUSCULAR | Status: DC | PRN

## 2014-06-14 MED ORDER — DEXTROSE 50 % IV SOLN
1.0000 | Freq: Once | INTRAVENOUS | Status: AC
  Administered 2014-06-14: 50 mL via INTRAVENOUS
  Filled 2014-06-14: qty 50

## 2014-06-14 MED ORDER — DEXTROSE 5 % IV SOLN
2.0000 ug/min | INTRAVENOUS | Status: DC
  Administered 2014-06-14: 30 ug/min via INTRAVENOUS
  Filled 2014-06-14: qty 16

## 2014-06-14 MED ORDER — MORPHINE BOLUS VIA INFUSION
5.0000 mg | INTRAVENOUS | Status: DC | PRN
  Filled 2014-06-14: qty 20

## 2014-06-14 MED ORDER — FENTANYL CITRATE 0.05 MG/ML IJ SOLN
100.0000 ug | Freq: Once | INTRAMUSCULAR | Status: AC
  Administered 2014-06-14: 100 ug via INTRAVENOUS

## 2014-06-14 MED ORDER — MIDAZOLAM HCL 2 MG/2ML IJ SOLN
2.0000 mg | INTRAMUSCULAR | Status: DC | PRN
  Administered 2014-06-14: 2 mg via INTRAVENOUS
  Filled 2014-06-14: qty 2

## 2014-06-14 MED ORDER — SODIUM CHLORIDE 0.9 % IV SOLN
2000.0000 mL | Freq: Once | INTRAVENOUS | Status: AC
  Administered 2014-06-14: 2000 mL via INTRAVENOUS

## 2014-06-14 MED ORDER — FENTANYL CITRATE 0.05 MG/ML IJ SOLN
100.0000 ug | INTRAMUSCULAR | Status: DC | PRN
  Administered 2014-06-14: 100 ug via INTRAVENOUS
  Filled 2014-06-14: qty 2

## 2014-06-14 MED ORDER — VANCOMYCIN HCL 10 G IV SOLR
2000.0000 mg | Freq: Once | INTRAVENOUS | Status: DC
  Filled 2014-06-14: qty 2000

## 2014-06-14 MED ORDER — MORPHINE SULFATE 25 MG/ML IV SOLN
10.0000 mg/h | INTRAVENOUS | Status: DC
  Administered 2014-06-14: 5 mg/h via INTRAVENOUS
  Filled 2014-06-14: qty 10

## 2014-06-14 NOTE — ED Provider Notes (Signed)
Patient seen on arrival He was found at home, unresponsive, CPR initiated, now with ROSC He was intubated by EMS in the field Critical care has been consulted Will follow closely   Sharyon Cable, MD 06/19/2014 1818

## 2014-06-14 NOTE — Progress Notes (Signed)
Extubated pt. Per MD order. MD and RN at bedside. No complications with procedure.

## 2014-06-14 NOTE — Progress Notes (Signed)
Patient expired at 2330 with RN Trilby Drummer and RN Baxter Flattery at bedside providing emotional support. No heart sounds was auscultated for over a minute by Cloretta Ned. Family and Md made aware.

## 2014-06-14 NOTE — Progress Notes (Signed)
Pt transported to CT then to 5Y51 without complications.

## 2014-06-14 NOTE — Progress Notes (Signed)
Critical ABG results given to [place], MD. FiO2 cut down from 100% to 40%.

## 2014-06-14 NOTE — ED Provider Notes (Signed)
D/w dr Halford Chessman Will place in ICU for now   Sharyon Cable, MD 06/19/2014 [phone]

## 2014-06-14 NOTE — ED Notes (Signed)
Per EMS: pt coming from home with c/o cardiac arrest. Pt was found by mom in bathroom slumped over, CBG 174, pt had weak carotid pulses, started pacing pt at 200 joules rate 70 and unsuccessful, pt was given 1/2 amp of atropine, HR increased to 140 the lost pulses. Pulses lost at 1535, one epi given, 2 minutes of CPR performed, ROSC at 1538, HR 150. Pt has 8.0 ETT placed, 16 g left EJ, left leg IO, pt was given 400 Normal Saline.

## 2014-06-14 NOTE — ED Notes (Signed)
Patient transported to CT 

## 2014-06-14 NOTE — H&P (Signed)
PULMONARY / CRITICAL CARE MEDICINE   Name: [name] MRN: [id number] DOB: 31-Jul-1960    ADMISSION DATE:  06/05/2014 CONSULTATION DATE:  06/10/2014  REFERRING MD :  EDP  CHIEF COMPLAINT:  Cardiac Arrest  INITIAL PRESENTATION:  53 y.o. M with hx of metastatic rectal cancer, brought to [facility] ED 12/23 after suffering a out of hospital cardiac arrest.  ROSC achieved within 3 minutes.  After extensive discussion with pt's family, decision made to proceed with comfort care.     STUDIES:  CT Head 12/23 >>> no acute process  SIGNIFICANT EVENTS: 12/23 - admit   HISTORY OF PRESENT ILLNESS:  Pt is encephalopathic; therefore, this HPI is obtained from chart review. [name] is a 53 y.o. M with PMH of metastatic rectal cancer who was brought to [facility] ED 12/23 after he suffered a out of hospital cardiac arrest.  ACLS protocol initiated in the field and pt intubated.  ROSC in 3 minutes.  CT chest from March 2015 showed innumerable bilateral pulmonary nodules c/w continued interval progression.  Despite recent imaging suggesting disease progression, as of March 2015, Mr. Garrette, his mother and sister declined further chemotherapy.  After extensive discussion with pt's mother and sister, decision was made to proceed with comfort care measures.   PAST MEDICAL HISTORY :   has a past medical history of Anemia and Rectal cancer (2011).  has past surgical history that includes Low anterior bowel resection (05/2010); Resection small bowel / closure ileostomy (10/07/2010); and Portacath placement (08/14/2011). Prior to Admission medications   Medication Sig Start Date End Date Taking? Authorizing Provider  cholecalciferol (VITAMIN D) 1000 UNITS tablet Take 1,000 Units by mouth daily.    Historical Provider, MD  HYDROcodone-acetaminophen (NORCO/VICODIN) 5-325 MG per tablet Take 1 tablet by mouth every 6 (six) hours as needed for pain. 07/28/12   Owens Shark, NP  prochlorperazine (COMPAZINE) 10 MG  tablet Take 1 tablet (10 mg total) by mouth every 6 (six) hours as needed. 08/18/11   Ladell Pier, MD   No Known Allergies  FAMILY HISTORY:  Family History  Problem Relation Age of Onset  . Heart disease Mother   . Diabetes Mother   . Diabetes Father   . Cancer Father     colon  . Stroke Father     SOCIAL HISTORY:  reports that he has never smoked. He has never used smokeless tobacco. He reports that he does not drink alcohol or use illicit drugs.  REVIEW OF SYSTEMS:  Unable to obtain as pt is encephalopathic.  SUBJECTIVE:   VITAL SIGNS: Temp:  [100 F (37.8 C)] 100 F (37.8 C) (12/23 1830) Pulse Rate:  [46-157] 137 (12/23 2000) Resp:  [16-22] 17 (12/23 2000) BP: (59-126)/(38-65) 96/65 mmHg (12/23 2000) SpO2:  [93 %-100 %] 100 % (12/23 2000) FiO2 (%):  [40 %-100 %] 40 % (12/23 1930) HEMODYNAMICS:   VENTILATOR SETTINGS: Vent Mode:  [-] PRVC FiO2 (%):  [40 %-100 %] 40 % Set Rate:  [16 bmp] 16 bmp Vt Set:  [640 mL] 640 mL PEEP:  [5 cmH20] 5 cmH20 INTAKE / OUTPUT: Intake/Output      12/23 0701 - 12/24 0700   I.V. 2000   Total Intake 2000   Net +2000         PHYSICAL EXAMINATION: General: WDWN male, in NAD. Neuro: Sedated. HEENT: [place]/AT. PERRL, sclerae anicteric. Cardiovascular: RRR, no M/R/G.  Lungs: Respirations even and unlabored.  CTA bilaterally, No W/R/R. Abdomen: BS x  4, soft, NT/ND.  Musculoskeletal: No gross deformities, no edema.  Skin: Intact, warm, no rashes.  LABS:  CBC  Recent Labs Lab 06/13/2014 1836  WBC 8.3  HGB 9.0*  HCT 30.6*  PLT 177   Coag's  Recent Labs Lab 05/26/2014 1836  APTT 47*  INR 1.57*   BMET  Recent Labs Lab 05/25/2014 1836  NA 139  K 5.4*  CL 106  CO2 15*  BUN 21  CREATININE 1.83*  GLUCOSE 74   Electrolytes  Recent Labs Lab 06/20/2014 1836  CALCIUM 9.1   Sepsis Markers  Recent Labs Lab 05/31/2014 1856  LATICACIDVEN 12.24*   ABG  Recent Labs Lab 05/23/2014 1841  PHART 7.184*  PCO2ART 33.6*   PO2ART 365.0*   Liver Enzymes No results for input(s): AST, ALT, ALKPHOS, BILITOT, ALBUMIN in the last 168 hours. Cardiac Enzymes No results for input(s): TROPONINI, PROBNP in the last 168 hours. Glucose  Recent Labs Lab 06/03/2014 1924  GLUCAP 57*    Imaging Ct Head Wo Contrast  06/08/2014   CLINICAL DATA:  Found down in bathroom, bradycardia. Patient coded en route to hospital. History of rectal cancer.  EXAM: CT HEAD WITHOUT CONTRAST  TECHNIQUE: Contiguous axial images were obtained from the base of the skull through the vertex without intravenous contrast.  COMPARISON:  None.  FINDINGS: The ventricles and sulci are normal. No intraparenchymal hemorrhage, mass effect nor midline shift. No acute large vascular territory infarcts.  No abnormal extra-axial fluid collections. Basal cisterns are patent.  No skull fracture. The included ocular globes and orbital contents are non-suspicious. Small RIGHT mastoid effusion. Acute on chronic appearing sphenoid ethmoidal sinusitis. Bony reabsorption of the clivus. Asymmetric fullness of the RIGHT nasopharyngeal soft tissues.  IMPRESSION: No acute intracranial process.  Asymmetric fullness of the RIGHT nose of pharynx, in addition, focal osteopenia of the clivus. Chronic sphenoid ethmoidal sinusitis. Clival findings may reflect focal osteopenia, sequelae of sphenoid sinusitis though, neoplasm not excluded. Consider MRI of the skullbase with contrast and fat saturated sequences on a nonemergent basis.   Electronically Signed   By: Elon Alas   On: 06/05/2014 21:17   Dg Chest Port 1 View  05/23/2014   CLINICAL DATA:  Cardiac arrest.  Orogastric tube placement.  EXAM: PORTABLE CHEST - 1 VIEW  COMPARISON:  09/02/2013 CT scan  FINDINGS: An endotracheal tube appears to be just into the right mainstem bronchus. This is mild positioned. Retraction of 3.5 cm recommended.  Reportedly there is an orogastric tube but I do not really appreciated. Electronic  device suggesting loop recorder over the right chest; small wire or line projects over the lower mediastinum.  Scattered nodules in the lungs favoring metastatic disease. Low lung volumes.  IMPRESSION: 1. Suspected right mainstem bronchus intubation. Retraction of the ET tube by 3.5 cm recommended. 2. Scattered pulmonary metastases.  Critical Value/emergent results were called by telephone at the time of interpretation on 06/02/2014 at 8:21 pm to Dr. Ripley Fraise , who verbally acknowledged these results.   Electronically Signed   By: Sherryl Barters M.D.   On: 05/23/2014 20:21    ASSESSMENT / PLAN:  PULMONARY OETT 12/23 >>> A: Acute respiratory failure following cardiac arrest P:   Full mechanical support for now until family arrives at bedside. Once family arrives, will extubate and proceed with full comfort care measures.  CARDIOVASCULAR A:  Cardiogenic shock s/p cardiac arrest P:  Continue epinephrine and levophed at current dose, do not escalate. Will d/c pressors once extubated.  RENAL A:   AG metabolic acidosis - lactate AKI P:   NS @ 125.  GASTROINTESTINAL A:   Nutrition P:   NPO.  HEMATOLOGIC A:   Chronic anemia VTE Prophylaxis P:  No intervention as will move to comfort care soon.  INFECTIOUS A:   No indication of infection P:   No intervention required.  ENDOCRINE A:   No known issues P:   No intervention required.  NEUROLOGIC A:   Acute metabolic encephalopathy P:   Sedation:  Fentanyl PRN / Versed PRN. RASS Goal: 0 to -1. Will move to full comfort care as soon as family arrives at bedside.   Family updated: Mother, sister, brother.  Decision made to move to full comfort care.  Interdisciplinary Family Meeting v Palliative Care Meeting:  N/A.  CC Time: 60 minutes.  Montey Hora, [place] Pgr: [phone]  or [phone] 06/04/2014, 8:46 PM  Merton Border, MD ; [facility] service [place]  [phone].  After 5:30 PM or weekends, call [phone]

## 2014-06-14 NOTE — ED Provider Notes (Signed)
CSN: [id number]     Arrival date & time 05/28/2014  1810 History   First MD Initiated Contact with Patient 06/09/2014 1810     Chief Complaint  Patient presents with  . Cardiac Arrest     (Consider location/radiation/quality/duration/timing/severity/associated sxs/prior Treatment) Patient is a 53 y.o. male presenting with general illness.  Illness Location:  Cardiac arrest Severity:  Severe Onset quality:  Sudden Duration: 2 minutes.  Was called out for unresponsiveness, found to have bradycardia, agonal resp. Timing:  Constant Progression since onset: had ROSC after 2 min of CPR. Chronicity:  New Context:  Metastatic rectal cancer, family reports recent abnormal eye movements, recently started "detox" from homeopathic doctor Relieved by:  Epinephrine Worsened by:  None Ineffective treatments:  Pacing, atropine made tachycardic however lost pulse.   Associated symptoms: no abdominal pain (family denies known), no chest pain (family denies known) and no cough (family denies known recent)   Risk factors:  Metastatic rectal cancer   Past Medical History  Diagnosis Date  . Anemia   . Rectal cancer 2011    uT3N0; neoadju txt & surgery; final path - no residual cancer; new Rectal cancer 10cm from AV; 06/2011; b/l lung nodules 06/2011; FOLFOX  restarted 07/2011   Past Surgical History  Procedure Laterality Date  . Low anterior bowel resection  05/2010    Lap assist; with loop ileostomy  . Resection small bowel / closure ileostomy  10/07/2010  . Portacath placement  08/14/2011    Procedure: INSERTION PORT-A-CATH;  Surgeon: Gayland Curry, MD,FACS;  Location: [facility];  Service: General;  Laterality: Right;  right subclavian    Family History  Problem Relation Age of Onset  . Heart disease Mother   . Diabetes Mother   . Diabetes Father   . Cancer Father     colon  . Stroke Father    History  Substance Use Topics  . Smoking status: Never Smoker   . Smokeless tobacco: Never Used  .  Alcohol Use: No    Review of Systems  Unable to perform ROS: Intubated  Respiratory: Negative for cough (family denies known recent).   Cardiovascular: Negative for chest pain (family denies known).  Gastrointestinal: Negative for abdominal pain (family denies known).      Allergies  Review of patient's allergies indicates no known allergies.  Home Medications   Prior to Admission medications   Medication Sig Start Date End Date Taking? Authorizing Provider  cholecalciferol (VITAMIN D) 1000 UNITS tablet Take 1,000 Units by mouth daily.    Historical Provider, MD  HYDROcodone-acetaminophen (NORCO/VICODIN) 5-325 MG per tablet Take 1 tablet by mouth every 6 (six) hours as needed for pain. 07/28/12   Owens Shark, NP  prochlorperazine (COMPAZINE) 10 MG tablet Take 1 tablet (10 mg total) by mouth every 6 (six) hours as needed. 08/18/11   Ladell Pier, MD   BP 38/25 mmHg  Pulse 31  Temp(Src) 99.3 F (37.4 C) (Oral)  Resp 11  Ht 5\' 9"  (1.753 m)  Wt 241 lb 6.5 oz (109.5 kg)  BMI 35.63 kg/m2  SpO2 89% Physical Exam  Constitutional: He appears well-developed and well-nourished. He appears toxic. He has a sickly appearance. He appears ill.  HENT:  Head: Normocephalic and atraumatic.  Mouth/Throat: No oropharyngeal exudate.  Eyes: Conjunctivae are normal. Pupils are equal, round, and reactive to light.  Neck: No tracheal deviation present.  Cardiovascular: Tachycardia present.   Pulmonary/Chest: No respiratory distress. He has no wheezes. He has no  rales.  Intubated Making respiratory effort  Abdominal: Soft. He exhibits no distension. There is no tenderness. There is no guarding.  Musculoskeletal: He exhibits no edema.  Neurological: He is unresponsive. GCS eye subscore is 1. GCS verbal subscore is 1. GCS motor subscore is 1.  Spontaneous movements, nonpurposeful, no response to pain  Skin: Skin is warm and dry. No rash noted. He is not diaphoretic.  Nursing note and vitals  reviewed.   ED Course  Procedures (including critical care time) Labs Review Labs Reviewed  CBC - Abnormal; Notable for the following:    RBC 3.42 (*)    Hemoglobin 9.0 (*)    HCT 30.6 (*)    MCHC 29.4 (*)    RDW 17.2 (*)    All other components within normal limits  APTT - Abnormal; Notable for the following:    aPTT 47 (*)    All other components within normal limits  BASIC METABOLIC PANEL - Abnormal; Notable for the following:    Potassium 5.4 (*)    CO2 15 (*)    Creatinine, Ser 1.83 (*)    GFR calc non Af Amer 40 (*)    GFR calc Af Amer 47 (*)    Anion gap 18 (*)    All other components within normal limits  PROTIME-INR - Abnormal; Notable for the following:    Prothrombin Time 18.9 (*)    INR 1.57 (*)    All other components within normal limits  CBG MONITORING, ED - Abnormal; Notable for the following:    Glucose-Capillary 57 (*)    All other components within normal limits  I-STAT CG4 LACTIC ACID, ED - Abnormal; Notable for the following:    Lactic Acid, Venous 12.24 (*)    All other components within normal limits  I-STAT ARTERIAL BLOOD GAS, ED - Abnormal; Notable for the following:    pH, Arterial 7.184 (*)    pCO2 arterial 33.6 (*)    pO2, Arterial 365.0 (*)    Bicarbonate 12.5 (*)    Acid-base deficit 14.0 (*)    All other components within normal limits  I-STAT TROPOININ, ED    Imaging Review Ct Head Wo Contrast  05/29/2014   CLINICAL DATA:  Found down in bathroom, bradycardia. Patient coded en route to hospital. History of rectal cancer.  EXAM: CT HEAD WITHOUT CONTRAST  TECHNIQUE: Contiguous axial images were obtained from the base of the skull through the vertex without intravenous contrast.  COMPARISON:  None.  FINDINGS: The ventricles and sulci are normal. No intraparenchymal hemorrhage, mass effect nor midline shift. No acute large vascular territory infarcts.  No abnormal extra-axial fluid collections. Basal cisterns are patent.  No skull fracture.  The included ocular globes and orbital contents are non-suspicious. Small RIGHT mastoid effusion. Acute on chronic appearing sphenoid ethmoidal sinusitis. Bony reabsorption of the clivus. Asymmetric fullness of the RIGHT nasopharyngeal soft tissues.  IMPRESSION: No acute intracranial process.  Asymmetric fullness of the RIGHT nose of pharynx, in addition, focal osteopenia of the clivus. Chronic sphenoid ethmoidal sinusitis. Clival findings may reflect focal osteopenia, sequelae of sphenoid sinusitis though, neoplasm not excluded. Consider MRI of the skullbase with contrast and fat saturated sequences on a nonemergent basis.   Electronically Signed   By: Elon Alas   On: 06/02/2014 21:17   Dg Chest Port 1 View  06/13/2014   CLINICAL DATA:  Cardiac arrest.  Orogastric tube placement.  EXAM: PORTABLE CHEST - 1 VIEW  COMPARISON:  09/02/2013 CT scan  FINDINGS: An endotracheal tube appears to be just into the right mainstem bronchus. This is mild positioned. Retraction of 3.5 cm recommended.  Reportedly there is an orogastric tube but I do not really appreciated. Electronic device suggesting loop recorder over the right chest; small wire or line projects over the lower mediastinum.  Scattered nodules in the lungs favoring metastatic disease. Low lung volumes.  IMPRESSION: 1. Suspected right mainstem bronchus intubation. Retraction of the ET tube by 3.5 cm recommended. 2. Scattered pulmonary metastases.  Critical Value/emergent results were called by telephone at the time of interpretation on 06/05/2014 at 8:21 pm to Dr. Ripley Fraise , who verbally acknowledged these results.   Electronically Signed   By: Sherryl Barters M.D.   On: 05/26/2014 20:21     EKG Interpretation   Date/Time:  Wednesday June 14 2014 18:12:54 EST Ventricular Rate:  158 PR Interval:  116 QRS Duration: 85 QT Interval:  294 QTC Calculation: 477 R Axis:   -22 Text Interpretation:  Sinus tachycardia Consider right atrial  enlargement  Borderline left axis deviation Borderline low voltage, extremity leads  Abnormal R-wave progression, late transition Repolarization abnormality,  prob rate related changed from prior rate is faster today Confirmed by  Christy Gentles  MD, [place] ([id number]) on 06/03/2014 6:35:12 PM      MDM   Final diagnoses:  Altered mental status, unspecified altered mental status type  Acute respiratory failure, unspecified whether with hypoxia or hypercapnia  Cardiac arrest    53 year old male with a history of metastatic rectal cancer presents post cardiac arrest. Per EMS they were called out for altered mental status and found patient to be slumped in the bathroom, unresponsive. On their initial evaluation glucose was normal, however patient was noted to have bradycardia and weak carotid pulses. He was paced without change, given atropine and developed tachycardia however lost pulses and CPR was initiated for PEA arrest.  ROSC was obtained after 2 minutes of CPR and epinephrine. He was intubated for airway protection without drugs.  On arrival to the emergency department he is tachycardic with initially normal blood pressures followed by hypotension.  Unclear cause of cardiac arrest, with possible etiologies including pulmonary embolus, sepsis, other cardiac arrest.   Labs show only mild hyperkalemia 5.4.  EKG shows sinus tachycardia and right atrial enlargement.  Discussed goals of care with patient's family in detail. They are very clear at this time that he is DNR, however would like to discuss other goals of care with his mother who who has been transferred to the emergency department for SVT.  Family currently discussing goals of care, however are not ready to make decisions at this time and will proceed with full scope of treatment, including doing blood cultures, giving vancomycin and Zosyn, head CT, and levophed infusion to maintain blood pressures. Pt with significant lactic acidosis and metabolic  acidosis. Patient was transferred to the ICU.  X-ray had been delayed due to other care however was noted to have a right mainstem intubation which was discussed with the ICU team by Dr. Christy Gentles.     Alvino Chapel, MD 06/18/14 [id number]  Sharyon Cable, MD 2014-06-18 1430

## 2014-06-14 NOTE — ED Notes (Signed)
NOTIFIED DR. Christy Gentles IN PERSON FOR PATIENTS PANIC LAB RESULTS OF CG4+ LACTIC ACID =12.24 mmol/L @19 :00 PM ,06/03/2014.

## 2014-06-15 LAB — GLUCOSE, CAPILLARY: Glucose-Capillary: 110 mg/dL — ABNORMAL HIGH (ref 70–99)

## 2014-06-21 ENCOUNTER — Other Ambulatory Visit: Payer: Medicare Other

## 2014-06-21 ENCOUNTER — Ambulatory Visit: Payer: Medicare Other | Admitting: Nurse Practitioner

## 2014-06-23 NOTE — Progress Notes (Signed)
230(ml)mg of morphine was wasted in the sink in the presence of charge nurse, Melene Plan.

## 2014-06-23 NOTE — Discharge Summary (Signed)
DEATH SUMMARY  DATE OF ADMISSION:  07-01-2023  DATE OF DISCHARGE/DEATH:  2023-07-01  ADMISSION DIAGNOSES:   Metastatic rectal cancer Cardiac arrest, s/p resuscitation Cardiogenic shock post arrest AKI Hyperkalemia DNR   DISCHARGE DIAGNOSES:   Metastatic rectal cancer Cardiac arrest, s/p resuscitation Cardiogenic shock post arrest AKI Hyperkalemia DNR Death due to refractory cardiogenic shock    HOSPITAL COURSE:   His hospitalization was very brief. He presented to ED after found unresponsive by family and underwent ACLS/CPR with ROSC but persistent cardiogenic shock. On the basis of his advanced malignancy, he was made DNR. He passed away peacefully shortly after arrival to the ICU    Cause of death:  Refractory cardiogenic shock after cardiac arrest, etiology unknown  Contributing factors: Metastatic rectal cancer  Autopsy:  No   Smoking:  No     Julian Border, MD;  PCCM service; Mobile [phone]

## 2014-06-23 NOTE — Progress Notes (Signed)
Chaplain responded to page to assist family who had gathered (daughter/husband).  Sat with MD during consult... Then responded to family request to meet pt's mother when she came in by EMS (cardiac event after son loaded onto EMS.Marland Kitchen) I welcomed pts. Mother and escorted her to room....then alerted family to her arrival.  Escorted niece and daughter to her room. MD planning to admit pt.  Will follow.  Rev. Jan [facility]

## 2014-06-23 NOTE — Progress Notes (Signed)
Utilization review completed. Erma Joubert, RN, BSN. 

## 2014-06-23 DEATH — deceased

## 2015-12-07 ENCOUNTER — Other Ambulatory Visit: Payer: Self-pay | Admitting: Nurse Practitioner
